# Patient Record
Sex: Male | Born: 1995
Health system: Southern US, Community
[De-identification: ages and names within clinical notes are randomized; demographics above are authoritative.]

## PROBLEM LIST (undated history)

## (undated) DIAGNOSIS — I10 Essential (primary) hypertension: Secondary | ICD-10-CM

## (undated) HISTORY — PX: WISDOM TOOTH EXTRACTION: SHX21

---
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
## 2014-04-13 ENCOUNTER — Emergency Department (HOSPITAL_BASED_OUTPATIENT_CLINIC_OR_DEPARTMENT_OTHER)
Admission: EM | Admit: 2014-04-13 | Discharge: 2014-04-13 | Disposition: A | Discharge status: Home / Self Care | Payer: 59 | Attending: Emergency Medicine | Admitting: Emergency Medicine

## 2014-04-13 ENCOUNTER — Encounter (HOSPITAL_BASED_OUTPATIENT_CLINIC_OR_DEPARTMENT_OTHER): Payer: Self-pay | Admitting: Emergency Medicine

## 2014-04-13 DIAGNOSIS — L03031 Cellulitis of right toe: Secondary | ICD-10-CM | POA: Insufficient documentation

## 2014-04-13 DIAGNOSIS — M79674 Pain in right toe(s): Secondary | ICD-10-CM | POA: Diagnosis present

## 2014-04-13 MED ORDER — LIDOCAINE-EPINEPHRINE 2 %-1:100000 IJ SOLN
20.0000 mL | Freq: Once | INTRAMUSCULAR | Status: AC
  Administered 2014-04-13: 20 mL
  Filled 2014-04-13: qty 1

## 2014-04-13 MED ORDER — SULFAMETHOXAZOLE-TRIMETHOPRIM 800-160 MG PO TABS: 1.0000 | ORAL_TABLET | Freq: Two times a day (BID) | ORAL | Status: AC

## 2014-04-13 NOTE — ED Provider Notes (Signed)
CSN: [id number]     Arrival date & time 04/13/14  2040 History   First MD Initiated Contact with Patient 04/13/14 2111     Chief Complaint  Patient presents with  . Toe Pain     (Consider location/radiation/quality/duration/timing/severity/associated sxs/prior Treatment) HPI Comments: This is a 18 y/o male who presents to the ED complaining of right big toe pain since May. Pt reports he had his toe drained in May for an ingrown toenail, states it was better for about 2 weeks and returned. He reports he ate nor the fact that it was still bothering him, until over the past few days when it started bothering him at night. Denies fevers or drainage. He has never seen a podiatrist. No injury or trauma.  Patient is a 18 y.o. male presenting with toe pain. The history is provided by the patient.  Toe Pain Pertinent negatives include no fever or numbness.    History reviewed. No pertinent past medical history. History reviewed. No pertinent past surgical history. History reviewed. No pertinent family history. History  Substance Use Topics  . Smoking status: Never Smoker   . Smokeless tobacco: Not on file  . Alcohol Use: No    Review of Systems  Constitutional: Negative.  Negative for fever.  HENT: Negative.   Respiratory: Negative.   Cardiovascular: Negative.   Musculoskeletal:       +R big toe pain.  Skin: Positive for color change.  Neurological: Negative for numbness.      Allergies  Review of patient's allergies indicates no known allergies.  Home Medications   Prior to Admission medications   Medication Sig Start Date End Date Taking? Authorizing Provider  sulfamethoxazole-trimethoprim (BACTRIM DS,SEPTRA DS) 800-160 MG per tablet Take 1 tablet by mouth 2 (two) times daily. 04/13/14 04/20/14  Jamelia Varano M Eual Lindstrom, PA-C   BP 157/87 mmHg  Pulse 88  Temp(Src) 99.2 F (37.3 C) (Oral)  Resp 18  Ht 5\' 7"  (1.702 m)  Wt 209 lb (94.802 kg)  BMI 32.73 kg/m2  SpO2 98% Physical  Exam  Constitutional: He is oriented to person, place, and time. He appears well-developed and well-nourished. No distress.  HENT:  Head: Normocephalic and atraumatic.  Eyes: Conjunctivae and EOM are normal.  Neck: Normal range of motion. Neck supple.  Cardiovascular: Normal rate, regular rhythm and normal heart sounds.   Pulmonary/Chest: Effort normal and breath sounds normal.  Musculoskeletal: Normal range of motion.  Paronychia of right great toe. Scabbing, erythema, and tenderness medial to nail bed and below cuticle line. Erythema does not spread throughout toe or cross joint line. FROM. Cap refill < 3 seconds.  Neurological: He is alert and oriented to person, place, and time.  Skin: Skin is warm and dry.  Psychiatric: He has a normal mood and affect. His behavior is normal.  Nursing note and vitals reviewed.   ED Course  NERVE BLOCK Date/Time: 04/13/2014 9:42 PM Performed by: Kathrynn SpeedHESS, Angelmarie Ponzo M Authorized by: Kathrynn SpeedHESS, Emalie Mcwethy M Consent: Verbal consent obtained. Risks and benefits: risks, benefits and alternatives were discussed Consent given by: patient Patient identity confirmed: verbally with patient Body area: lower extremity Nerve: digital Laterality: right Patient sedated: no Patient position: sitting Needle gauge: 25 G Location technique: anatomical landmarks Local anesthetic: lidocaine 2% with epinephrine Anesthetic total: 4 ml Outcome: pain improved  INCISION AND DRAINAGE Performed by: Celene SkeenHess, Tyrome Donatelli Consent: Verbal consent obtained. Risks and benefits: risks, benefits and alternatives were discussed Type: abscess  Body area: right great toe  Anesthesia: digital  block  Local anesthetic: lidocaine 2% with epinephrine  Anesthetic total: 4 ml  Complexity: simple  Drainage: purulent  Drainage amount: medium  Packing material: none  Patient tolerance: Patient tolerated the procedure well with no immediate complications.     (including critical care  time) Labs Review Labs Reviewed - No data to display  Imaging Review No results found.   EKG Interpretation None      MDM   Final diagnoses:  Paronychia of great toe, right   Pt with paronychia. NAD. VSS. This has been ongoing since May, 6 months ago. I advised pt to be evaluated earlier if this were to occur again. Paronychia drained. Small area of surrounding cellulitis. Will start pt on abx. Advised warm soaks. Stable for d/c. Return precautions given. Patient states understanding of treatment care plan and is agreeable.  Kathrynn SpeedRobyn M Linnette Panella, PA-C 04/13/14 [phone]  Toy CookeyMegan Docherty, MD 04/14/14 [phone]

## 2014-04-13 NOTE — Discharge Instructions (Signed)
Take bactrim as directed twice daily. Follow up with the podiatrist. Use warm soaks frequently throughout the day.  Paronychia Paronychia is an inflammatory reaction involving the folds of the skin surrounding the fingernail. This is commonly caused by an infection in the skin around a nail. The most common cause of paronychia is frequent wetting of the hands (as seen with bartenders, food servers, nurses or others who wet their hands). This makes the skin around the fingernail susceptible to infection by bacteria (germs) or fungus. Other predisposing factors are:  Aggressive manicuring.  Nail biting.  Thumb sucking. The most common cause is a staphylococcal (a type of germ) infection, or a fungal (Candida) infection. When caused by a germ, it usually comes on suddenly with redness, swelling, pus and is often painful. It may get under the nail and form an abscess (collection of pus), or form an abscess around the nail. If the nail itself is infected with a fungus, the treatment is usually prolonged and may require oral medicine for up to one year. Your caregiver will determine the length of time treatment is required. The paronychia caused by bacteria (germs) may largely be avoided by not pulling on hangnails or picking at cuticles. When the infection occurs at the tips of the finger it is called felon. When the cause of paronychia is from the herpes simplex virus (HSV) it is called herpetic whitlow. TREATMENT  When an abscess is present treatment is often incision and drainage. This means that the abscess must be cut open so the pus can get out. When this is done, the following home care instructions should be followed. HOME CARE INSTRUCTIONS   It is important to keep the affected fingers very dry. Rubber or plastic gloves over cotton gloves should be used whenever the hand must be placed in water.  Keep wound clean, dry and dressed as suggested by your caregiver between warm soaks or warm  compresses.  Soak in warm water for fifteen to twenty minutes three to four times per day for bacterial infections. Fungal infections are very difficult to treat, so often require treatment for long periods of time.  For bacterial (germ) infections take antibiotics (medicine which kill germs) as directed and finish the prescription, even if the problem appears to be solved before the medicine is gone.  Only take over-the-counter or prescription medicines for pain, discomfort, or fever as directed by your caregiver. SEEK IMMEDIATE MEDICAL CARE IF:  You have redness, swelling, or increasing pain in the wound.  You notice pus coming from the wound.  You have a fever.  You notice a bad smell coming from the wound or dressing. Document Released: 11/05/2000 Document Revised: 08/04/2011 Document Reviewed: 07/07/2008 [facility] Patient Information 2015 [place], [place]. This information is not intended to replace advice given to you by your health care provider. Make sure you discuss any questions you have with your health care provider.

## 2014-04-13 NOTE — ED Notes (Signed)
Rt great toe pain,  Ingrown toe nail

## 2014-08-21 ENCOUNTER — Emergency Department (HOSPITAL_BASED_OUTPATIENT_CLINIC_OR_DEPARTMENT_OTHER)
Admission: EM | Admit: 2014-08-21 | Discharge: 2014-08-21 | Disposition: A | Discharge status: Home / Self Care | Payer: Self-pay | Attending: Emergency Medicine | Admitting: Emergency Medicine

## 2014-08-21 ENCOUNTER — Encounter (HOSPITAL_BASED_OUTPATIENT_CLINIC_OR_DEPARTMENT_OTHER): Payer: Self-pay | Admitting: *Deleted

## 2014-08-21 DIAGNOSIS — L600 Ingrowing nail: Secondary | ICD-10-CM | POA: Insufficient documentation

## 2014-08-21 MED ORDER — TRAMADOL HCL 50 MG PO TABS: 50.0000 mg | ORAL_TABLET | Freq: Four times a day (QID) | ORAL | Status: DC | PRN

## 2014-08-21 MED ORDER — IBUPROFEN 800 MG PO TABS: 800.0000 mg | ORAL_TABLET | Freq: Three times a day (TID) | ORAL | Status: DC | PRN

## 2014-08-21 MED ORDER — LIDOCAINE HCL (PF) 1 % IJ SOLN
5.0000 mL | Freq: Once | INTRAMUSCULAR | Status: AC
  Administered 2014-08-21: 5 mL
  Filled 2014-08-21: qty 5

## 2014-08-21 NOTE — ED Notes (Signed)
In grown toenail to his right great toe.

## 2014-08-21 NOTE — ED Notes (Signed)
EDP at bedside for procedure to right great toe

## 2014-08-21 NOTE — ED Provider Notes (Signed)
TIME SEEN: 9:10 PM  CHIEF COMPLAINT: Right great toe pain  HPI: Pt is a 19 y.o. male with history of recurrent ingrown 2 mils to the right great toe who is here in November with a paronychia he states he has had pain and swelling in this toenail since that time but has not followed up with his primary care physician. Denies any fever. No drainage. No injury. No numbness, tingling or focal weakness.  ROS: See HPI Constitutional: no fever  Eyes: no drainage  ENT: no runny nose   Cardiovascular:  no chest pain  Resp: no SOB  GI: no vomiting GU: no dysuria Integumentary: no rash  Allergy: no hives  Musculoskeletal: no leg swelling  Neurological: no slurred speech ROS otherwise negative  PAST MEDICAL HISTORY/PAST SURGICAL HISTORY:  History reviewed. No pertinent past medical history.  MEDICATIONS:  Prior to Admission medications   Not on File    ALLERGIES:  No Known Allergies  SOCIAL HISTORY:  History  Substance Use Topics  . Smoking status: Never Smoker   . Smokeless tobacco: Not on file  . Alcohol Use: No    FAMILY HISTORY: No family history on file.  EXAM: BP 143/70 mmHg  Pulse 81  Temp(Src) 98.7 F (37.1 C) (Oral)  Resp 20  Ht 5\' 7"  (1.702 m)  Wt 200 lb (90.719 kg)  BMI 31.32 kg/m2  SpO2 100% CONSTITUTIONAL: Alert and oriented and responds appropriately to questions. Well-appearing; well-nourished HEAD: Normocephalic EYES: Conjunctivae clear, PERRL ENT: normal nose; no rhinorrhea; moist mucous membranes; pharynx without lesions noted NECK: Supple, no meningismus, no LAD  CARD: RRR; S1 and S2 appreciated; no murmurs, no clicks, no rubs, no gallops RESP: Normal chest excursion without splinting or tachypnea; breath sounds clear and equal bilaterally; no wheezes, no rhonchi, no rales,  ABD/GI: Normal bowel sounds; non-distended; soft, non-tender, no rebound, no guarding BACK:  The back appears normal and is non-tender to palpation, there is no CVA  tenderness EXT: Ingrown toenail to the right great toe to the medial aspect with thickening of the skin around the medial aspect of the toenail but no obvious paronychia, no drainage, no erythema or warmth, no joint effusions, no bony tenderness, no sign of gout or cellulitis, Normal ROM in all joints; 2+ DP pulses bilaterally, sensation to light touch intact diffusely, otherwise extremities are non-tender to palpation; no edema; normal capillary refill; no cyanosis    SKIN: Normal color for age and race; warm NEURO: Moves all extremities equally PSYCH: The patient's mood and manner are appropriate. Grooming and personal hygiene are appropriate.  MEDICAL DECISION MAKING: Patient here with ingrown toenail without obvious sign of infection. He is requesting that I tried to remove the toenail today. Have discussed with patient and family that the toenail will continue to grow back and he needs to follow-up with her primary care physician or podiatrist. He is neurovascularly intact distally with no history of injury.  ED PROGRESS: Partial nail removal of the nail of the right great toe that was embedded under the skin on the medial aspect without any obvious sign of paronychia and no purulent drainage. Patient has pain relief after digital block. We'll discharge with prescriptions for ibuprofen, tramadol. We'll provide work note. Will also give information for podiatry follow-up. I do not feel he needs to be on antibiotics at this time. Have instructed him to soak his foot several times a day and to follow-up as an outpatient otherwise his nail will grow back and he  will have another ingrown 10 ingrown toenail that is at risk for infection. Discussed return precautions. He verbalizes understanding and is comfortable with plan.   NAIL REMOVAL Date/Time: 08/21/2014 9:59 PM Performed by: Raelyn Number Authorized by: Raelyn Number Consent: Verbal consent obtained. Risks and benefits: risks, benefits and  alternatives were discussed Consent given by: patient Patient identity confirmed: verbally with patient Location: right foot Location details: right big toe Anesthesia: local infiltration Local anesthetic: lidocaine 1% without epinephrine Anesthetic total: 5 ml Patient sedated: no Preparation: skin prepped with Betadine and sterile field established Amount removed: partial Side: Medial  Nail bed sutured: no Dressing: antibiotic ointment and 4x4 Patient tolerance: Patient tolerated the procedure well with no immediate complications  NERVE BLOCK Date/Time: 08/21/2014 10:02 PM Performed by: Raelyn Number Authorized by: Raelyn Number Consent: Verbal consent obtained. Risks and benefits: risks, benefits and alternatives were discussed Consent given by: patient Patient identity confirmed: verbally with patient Indications: pain relief Body area: lower extremity Nerve: digital Laterality: right Patient sedated: no Preparation: Patient was prepped and draped in the usual sterile fashion. Patient position: sitting Needle gauge: 25 G Location technique: anatomical landmarks Local anesthetic: lidocaine 1% without epinephrine Anesthetic total: 5 ml Outcome: pain improved Patient tolerance: Patient tolerated the procedure well with no immediate complications    Layla Maw Tanyiah Laurich, DO 08/21/14 2203

## 2014-08-21 NOTE — Discharge Instructions (Signed)
Ingrown Toenail An ingrown toenail occurs when the sharp edge of your toenail grows into the skin. Causes of ingrown toenails include toenails clipped too far back or poorly fitting shoes. Activities involving sudden stops (basketball, tennis) causing "toe jamming" may lead to an ingrown nail. HOME CARE INSTRUCTIONS   Soak the whole foot in warm soapy water for 20 minutes, 3 times per day.  You may lift the edge of the nail away from the sore skin by wedging a small piece of cotton under the corner of the nail. Be careful not to dig (traumatize) and cause more injury to the area.  Wear shoes that fit well. While the ingrown nail is causing problems, sandals may be beneficial.  Trim your toenails regularly and carefully. Cut your toenails straight across, not in a curve. This will prevent injury to the skin at the corners of the toenail.  Keep your feet clean and dry.  Crutches may be helpful early in treatment if walking is painful.  Antibiotics, if prescribed, should be taken as directed.  Return for a wound check in 2 days or as directed.  Only take over-the-counter or prescription medicines for pain, discomfort, or fever as directed by your caregiver. SEEK IMMEDIATE MEDICAL CARE IF:   You have a fever.  You have increasing pain, redness, swelling, or heat at the wound site.  Your toe is not better in 7 days. If conservative treatment is not successful, surgical removal of a portion or all of the nail may be necessary. MAKE SURE YOU:   Understand these instructions.  Will watch your condition.  Will get help right away if you are not doing well or get worse. Document Released: 05/09/2000 Document Revised: 08/04/2011 Document Reviewed: 05/03/2008 ExitCare Patient Information 2015 [employer]. This information is not intended to replace advice given to you by your health care provider. Make sure you discuss any questions you have with your health care provider.  

## 2014-09-06 ENCOUNTER — Emergency Department (HOSPITAL_BASED_OUTPATIENT_CLINIC_OR_DEPARTMENT_OTHER)
Admission: EM | Admit: 2014-09-06 | Discharge: 2014-09-06 | Disposition: A | Discharge status: Home / Self Care | Payer: Medicaid Other | Attending: Emergency Medicine | Admitting: Emergency Medicine

## 2014-09-06 ENCOUNTER — Encounter (HOSPITAL_BASED_OUTPATIENT_CLINIC_OR_DEPARTMENT_OTHER): Payer: Self-pay | Admitting: *Deleted

## 2014-09-06 DIAGNOSIS — H6122 Impacted cerumen, left ear: Secondary | ICD-10-CM | POA: Insufficient documentation

## 2014-09-06 NOTE — ED Notes (Signed)
Left ear pain starting today 

## 2014-09-06 NOTE — ED Notes (Signed)
MD at bedside. 

## 2014-09-06 NOTE — ED Provider Notes (Signed)
CSN: [id number]     Arrival date & time 09/06/14  1035 History  This chart was scribed for Glynn Octave, MD by Leone Payor, ED Scribe. This patient was seen in room MH03/MH03 and the patient's care was started 11:46 AM.    Chief Complaint  Patient presents with  . Otalgia    The history is provided by the patient. No language interpreter was used.     HPI Comments: Kevin Sosa is a 19 y.o. male who presents to the Emergency Department complaining of constant left sided otalgia that began this morning. He states the pain is aggravated by noise but denies alleviating factors. He denies similar symptoms in the past. He denies fever, rhinorrhea, sore throat, HA.   History reviewed. No pertinent past medical history. History reviewed. No pertinent past surgical history. No family history on file. History  Substance Use Topics  . Smoking status: Never Smoker   . Smokeless tobacco: Never Used  . Alcohol Use: No    Review of Systems  A complete 10 system review of systems was obtained and all systems are negative except as noted in the HPI and PMH.    Allergies  Review of patient's allergies indicates no known allergies.  Home Medications   Prior to Admission medications   Medication Sig Start Date End Date Taking? Authorizing Provider  ibuprofen (ADVIL,MOTRIN) 800 MG tablet Take 1 tablet (800 mg total) by mouth every 8 (eight) hours as needed for mild pain. 08/21/14   Kristen N Ward, DO  traMADol (ULTRAM) 50 MG tablet Take 1 tablet (50 mg total) by mouth every 6 (six) hours as needed. 08/21/14   Kristen N Ward, DO   BP 144/66 mmHg  Pulse 78  Temp(Src) 98.3 F (36.8 C) (Oral)  Resp 16  Ht  (1.702 m)  Wt 200 lb (90.719 kg)  BMI 31.32 kg/m2  SpO2 100% Physical Exam  Constitutional: He is oriented to person, place, and time. He appears well-developed and well-nourished. No distress.  HENT:  Head: Normocephalic and atraumatic.  Right Ear: Tympanic membrane, external  ear and ear canal normal.  Left Ear: Tympanic membrane and external ear normal.  Mouth/Throat: Oropharynx is clear and moist. No oropharyngeal exudate.  Left ear: normal external appearance, no tragus pain, no mastoid pain. Bilateral TM's appears normal, few dried pieces of wax in ear canal   Eyes: Conjunctivae and EOM are normal. Pupils are equal, round, and reactive to light.  Neck: Normal range of motion. Neck supple.  No meningismus.  Cardiovascular: Normal rate, regular rhythm, normal heart sounds and intact distal pulses.   No murmur heard. Pulmonary/Chest: Effort normal and breath sounds normal. No respiratory distress.  Abdominal: Soft. There is no tenderness. There is no rebound and no guarding.  Musculoskeletal: Normal range of motion. He exhibits no edema or tenderness.  Neurological: He is alert and oriented to person, place, and time. No cranial nerve deficit. He exhibits normal muscle tone. Coordination normal.  No ataxia on finger to nose bilaterally. No pronator drift. 5/5 strength throughout. CN 2-12 intact. Negative Romberg. Equal grip strength. Sensation intact. Gait is normal.   Skin: Skin is warm.  Psychiatric: He has a normal mood and affect. His behavior is normal.  Nursing note and vitals reviewed.   ED Course  Procedures (including critical care time)  DIAGNOSTIC STUDIES: Oxygen Saturation is 100% on RA, normal by my interpretation.    COORDINATION OF CARE: 11:48 AM Discussed treatment plan with pt at bedside  and pt agreed to plan.   Labs Review Labs Reviewed - No data to display  Imaging Review No results found.   EKG Interpretation None      MDM   Final diagnoses:  Excessive cerumen in ear canal, left   atraumatic left ear pain today. No difficulty breathing or swallowing. No bleeding from the ear.  Exam shows no evidence of otitis externa or media. There is a small amount of dry cerumen in the ear canal.  Symptoms improved after  irrigation. Tympanic membrane is intact.  Discussed earwax removal precautions. Follow-up with PCP.   Glynn OctaveStephen Janes Colegrove, MD 09/06/14 1311

## 2014-09-06 NOTE — Discharge Instructions (Signed)
Cerumen Impaction °A cerumen impaction is when the wax in your ear forms a plug. This plug usually causes reduced hearing. Sometimes it also causes an earache or dizziness. Removing a cerumen impaction can be difficult and painful. The wax sticks to the ear canal. The canal is sensitive and bleeds easily. If you try to remove a heavy wax buildup with a cotton tipped swab, you may push it in further. °Irrigation with water, suction, and small ear curettes may be used to clear out the wax. If the impaction is fixed to the skin in the ear canal, ear drops may be needed for a few days to loosen the wax. People who build up a lot of wax frequently can use ear wax removal products available in your local drugstore. °SEEK MEDICAL CARE IF:  °You develop an earache, increased hearing loss, or marked dizziness. °Document Released: 06/19/2004 Document Revised: 08/04/2011 Document Reviewed: 08/09/2009 °ExitCare® Patient Information ©2015 [employer]. This information is not intended to replace advice given to you by your health care provider. Make sure you discuss any questions you have with your health care provider. ° °

## 2014-09-06 NOTE — ED Notes (Signed)
Pt given note for school

## 2014-10-11 ENCOUNTER — Emergency Department (HOSPITAL_BASED_OUTPATIENT_CLINIC_OR_DEPARTMENT_OTHER): Payer: Medicaid Other

## 2014-10-11 ENCOUNTER — Encounter (HOSPITAL_BASED_OUTPATIENT_CLINIC_OR_DEPARTMENT_OTHER): Payer: Self-pay | Admitting: *Deleted

## 2014-10-11 ENCOUNTER — Emergency Department (HOSPITAL_BASED_OUTPATIENT_CLINIC_OR_DEPARTMENT_OTHER)
Admission: EM | Admit: 2014-10-11 | Discharge: 2014-10-11 | Disposition: A | Discharge status: Home / Self Care | Payer: Medicaid Other | Attending: Emergency Medicine | Admitting: Emergency Medicine

## 2014-10-11 DIAGNOSIS — Y92310 Basketball court as the place of occurrence of the external cause: Secondary | ICD-10-CM | POA: Insufficient documentation

## 2014-10-11 DIAGNOSIS — S62619A Displaced fracture of proximal phalanx of unspecified finger, initial encounter for closed fracture: Secondary | ICD-10-CM

## 2014-10-11 DIAGNOSIS — Y9367 Activity, basketball: Secondary | ICD-10-CM | POA: Insufficient documentation

## 2014-10-11 DIAGNOSIS — X58XXXA Exposure to other specified factors, initial encounter: Secondary | ICD-10-CM | POA: Insufficient documentation

## 2014-10-11 DIAGNOSIS — S6991XA Unspecified injury of right wrist, hand and finger(s), initial encounter: Secondary | ICD-10-CM

## 2014-10-11 DIAGNOSIS — S62612A Displaced fracture of proximal phalanx of right middle finger, initial encounter for closed fracture: Secondary | ICD-10-CM | POA: Insufficient documentation

## 2014-10-11 DIAGNOSIS — Y998 Other external cause status: Secondary | ICD-10-CM | POA: Insufficient documentation

## 2014-10-11 MED ORDER — TRAMADOL HCL 50 MG PO TABS: 50.0000 mg | ORAL_TABLET | Freq: Four times a day (QID) | ORAL | Status: DC | PRN

## 2014-10-11 NOTE — Discharge Instructions (Signed)

## 2014-10-11 NOTE — ED Notes (Signed)
Pt c/o right middle finger injury x 3 days ago

## 2014-10-11 NOTE — ED Notes (Signed)
Pt unable to sign pharm in chart

## 2014-10-11 NOTE — ED Provider Notes (Signed)
CSN: [id number]     Arrival date & time 10/11/14  1446 History   First MD Initiated Contact with Patient 10/11/14 1453     Chief Complaint  Patient presents with  . Finger Injury     (Consider location/radiation/quality/duration/timing/severity/associated sxs/prior Treatment) HPI Comments: Pt comes in with c/o right middle finger injury. Pt states that he was playing basketball and the finger turned sideways. He put the finger back in place but now he is having pain over the mip joint. Denies numbness.  No language interpreter was used.    History reviewed. No pertinent past medical history. History reviewed. No pertinent past surgical history. History reviewed. No pertinent family history. History  Substance Use Topics  . Smoking status: Never Smoker   . Smokeless tobacco: Never Used  . Alcohol Use: No    Review of Systems  All other systems reviewed and are negative.     Allergies  Review of patient's allergies indicates no known allergies.  Home Medications   Prior to Admission medications   Medication Sig Start Date End Date Taking? Authorizing Provider  ibuprofen (ADVIL,MOTRIN) 800 MG tablet Take 1 tablet (800 mg total) by mouth every 8 (eight) hours as needed for mild pain. 08/21/14   Kristen N Ward, DO  traMADol (ULTRAM) 50 MG tablet Take 1 tablet (50 mg total) by mouth every 6 (six) hours as needed. 08/21/14   Kristen N Ward, DO   BP 131/63 mmHg  Pulse 93  Temp(Src) 98.9 F (37.2 C) (Oral)  Resp 16  Ht 5\' 7"  (1.702 m)  Wt 200 lb (90.719 kg)  BMI 31.32 kg/m2  SpO2 100% Physical Exam  Constitutional: He is oriented to person, place, and time. He appears well-developed and well-nourished.  Cardiovascular: Normal rate and regular rhythm.   Pulmonary/Chest: Effort normal and breath sounds normal.  Musculoskeletal:  Tenderness with mild swelling to the pip and mip of the right third finger  Neurological: He is alert and oriented to person, place, and time.   Skin: Skin is warm and dry.  Psychiatric: He has a normal mood and affect.  Nursing note and vitals reviewed.   ED Course  Procedures (including critical care time) Labs Review Labs Reviewed - No data to display  Imaging Review Dg Hand Complete Right  10/11/2014   CLINICAL DATA:  injured his right middle finger playing basketball 3 days ago, has pains and swelling to right middle finger proximal IP joint and down into the metacarpals, no other complaints  EXAM: RIGHT HAND - COMPLETE 3+ VIEW  COMPARISON:  None.  FINDINGS: Oblique minimally comminuted fracture of the distal shaft proximal phalanx right long finger, distracted less than 2 mm. There is extension to the distal subchondral cortex suggested on the oblique and lateral radiograph, but no step-off deformity. Normal alignment and mineralization. No other acute bone abnormalities. No significant osseous degenerative change.  IMPRESSION: 1. Oblique minimally comminuted fracture, proximal phalanx right long finger.   Electronically Signed   By: Corlis Leak  Hassell M.D.   On: 10/11/2014 15:56     EKG Interpretation None      MDM   Final diagnoses:  Proximal phalanx fracture of finger, closed, initial encounter    Pt splinted and given hand follow up. Given ultram for pain. Neurovascularly intact    Kevin LowerVrinda [name], NP 10/11/14 1604  Geoffery Lyonsouglas Delo, MD 10/12/14 1308

## 2014-12-14 ENCOUNTER — Encounter (HOSPITAL_BASED_OUTPATIENT_CLINIC_OR_DEPARTMENT_OTHER): Payer: Self-pay

## 2014-12-14 ENCOUNTER — Emergency Department (HOSPITAL_BASED_OUTPATIENT_CLINIC_OR_DEPARTMENT_OTHER)
Admission: EM | Admit: 2014-12-14 | Discharge: 2014-12-14 | Disposition: A | Discharge status: Home / Self Care | Payer: Medicaid Other | Attending: Emergency Medicine | Admitting: Emergency Medicine

## 2014-12-14 ENCOUNTER — Emergency Department (HOSPITAL_BASED_OUTPATIENT_CLINIC_OR_DEPARTMENT_OTHER): Payer: Medicaid Other

## 2014-12-14 DIAGNOSIS — L03031 Cellulitis of right toe: Secondary | ICD-10-CM | POA: Insufficient documentation

## 2014-12-14 DIAGNOSIS — L03011 Cellulitis of right finger: Secondary | ICD-10-CM

## 2014-12-14 DIAGNOSIS — L600 Ingrowing nail: Secondary | ICD-10-CM

## 2014-12-14 MED ORDER — LIDOCAINE HCL (PF) 1 % IJ SOLN
5.0000 mL | Freq: Once | INTRAMUSCULAR | Status: DC
  Filled 2014-12-14: qty 5

## 2014-12-14 MED ORDER — LIDOCAINE HCL (PF) 1 % IJ SOLN
30.0000 mL | Freq: Once | INTRAMUSCULAR | Status: DC
  Filled 2014-12-14: qty 30

## 2014-12-14 MED ORDER — SULFAMETHOXAZOLE-TRIMETHOPRIM 800-160 MG PO TABS: 1.0000 | ORAL_TABLET | Freq: Two times a day (BID) | ORAL | Status: AC

## 2014-12-14 MED ORDER — IBUPROFEN 800 MG PO TABS: 800.0000 mg | ORAL_TABLET | Freq: Three times a day (TID) | ORAL | Status: DC

## 2014-12-14 MED ORDER — CEPHALEXIN 500 MG PO CAPS: 500.0000 mg | ORAL_CAPSULE | Freq: Four times a day (QID) | ORAL | Status: DC

## 2014-12-14 NOTE — ED Notes (Signed)
C/o right foot pain/swelling x 2.5 months-injury 1-1.5 yrs ago-states he has been seen x 3 for same c/o

## 2014-12-14 NOTE — Discharge Instructions (Signed)

## 2014-12-14 NOTE — ED Notes (Signed)
MD at bedside. 

## 2014-12-14 NOTE — ED Provider Notes (Signed)
CSN: [id number]     Arrival date & time 12/14/14  1907 History  This chart was scribed for Kevin Octave, MD by Budd Palmer, ED Scribe. This patient was seen in room MHFT1/MHFT1 and the patient's care was started at 8:39 PM.    Chief Complaint  Patient presents with  . Foot Pain   The history is provided by the patient. No language interpreter was used.   HPI Comments: Kevin Sosa is a 19 y.o. male who presents to the Emergency Department complaining of constant right great toe pain and swelling onset 4 months ago. He states the swelling has now started to go around from the left to the right side of the toenail. He was at the ED 3 months ago, where he was told he had an ingrown nail. He was given antibiotics which effected great improvement. He re-injured the toe a few months ago, and states it has been flaring up ever since. Pt denies fever, vomiting, CP, SOB, and abdominal pain.  History reviewed. No pertinent past medical history. History reviewed. No pertinent past surgical history. No family history on file. History  Substance Use Topics  . Smoking status: Never Smoker   . Smokeless tobacco: Never Used  . Alcohol Use: No    Review of Systems A complete 10 system review of systems was obtained and all systems are negative except as noted in the HPI and PMH.   Allergies  Review of patient's allergies indicates no known allergies.  Home Medications   Prior to Admission medications   Medication Sig Start Date End Date Taking? Authorizing Provider  cephALEXin (KEFLEX) 500 MG capsule Take 1 capsule (500 mg total) by mouth 4 (four) times daily. 12/14/14   Kevin Octave, MD  ibuprofen (ADVIL,MOTRIN) 800 MG tablet Take 1 tablet (800 mg total) by mouth 3 (three) times daily. 12/14/14   Kevin Octave, MD  sulfamethoxazole-trimethoprim (BACTRIM DS,SEPTRA DS) 800-160 MG per tablet Take 1 tablet by mouth 2 (two) times daily. 12/14/14 12/21/14  Kevin Octave, MD   BP 165/87 mmHg   Pulse 86  Temp(Src) 98.9 F (37.2 C) (Oral)  Resp 18  Ht 5\' 7"  (1.702 m)  Wt 215 lb (97.523 kg)  BMI 33.67 kg/m2  SpO2 100% Physical Exam  Constitutional: He is oriented to person, place, and time. He appears well-developed and well-nourished. No distress.  HENT:  Head: Normocephalic and atraumatic.  Mouth/Throat: Oropharynx is clear and moist. No oropharyngeal exudate.  Eyes: Conjunctivae and EOM are normal. Pupils are equal, round, and reactive to light.  Neck: Normal range of motion. Neck supple.  No meningismus.  Cardiovascular: Normal rate, regular rhythm, normal heart sounds and intact distal pulses.   No murmur heard. Pulmonary/Chest: Effort normal and breath sounds normal. No respiratory distress.  Abdominal: Soft. There is no tenderness. There is no rebound and no guarding.  Musculoskeletal: Normal range of motion. He exhibits no edema or tenderness.  Right great toe has thickening of nail folds with medial worse than lateral.Diffuse tenderness with mild erythema. Ingrown toenail. Some purulent and bloody drainage.  Neurological: He is alert and oriented to person, place, and time. No cranial nerve deficit. He exhibits normal muscle tone. Coordination normal.  No ataxia on finger to nose bilaterally. No pronator drift. 5/5 strength throughout. CN 2-12 intact. Negative Romberg. Equal grip strength. Sensation intact. Gait is normal.   Skin: Skin is warm. There is erythema.  Psychiatric: He has a normal mood and affect. His behavior is normal.  Nursing  note and vitals reviewed.   ED Course  NERVE BLOCK Date/Time: 12/14/2014 11:30 PM Performed by: Kevin Sosa Authorized by: Kevin Sosa Consent: Verbal consent obtained. Risks and benefits: risks, benefits and alternatives were discussed Consent given by: patient Patient understanding: patient states understanding of the procedure being performed Patient consent: the patient's understanding of the procedure matches  consent given Procedure consent: procedure consent matches procedure scheduled Patient identity confirmed: provided demographic data Indications: pain relief Body area: lower extremity Nerve: digital Laterality: right Patient sedated: no Preparation: Patient was prepped and draped in the usual sterile fashion. Patient position: sitting Needle gauge: 25 G Location technique: anatomical landmarks Local anesthetic: lidocaine 1% without epinephrine Anesthetic total: 10 ml Outcome: pain improved Patient tolerance: Patient tolerated the procedure well with no immediate complications  NAIL REMOVAL Date/Time: 12/14/2014 11:30 PM Performed by: Kevin Sosa Authorized by: Kevin Sosa Consent: Verbal consent obtained. Risks and benefits: risks, benefits and alternatives were discussed Consent given by: patient Patient understanding: patient states understanding of the procedure being performed Patient consent: the patient's understanding of the procedure matches consent given Procedure consent: procedure consent matches procedure scheduled Relevant documents: relevant documents present and verified Patient identity confirmed: provided demographic data and verbally with patient Time out: Immediately prior to procedure a "time out" was called to verify the correct patient, procedure, equipment, support staff and site/side marked as required. Location: right foot Location details: right big toe Anesthesia: local infiltration Local anesthetic: lidocaine 1% without epinephrine Anesthetic total: 10 ml Patient sedated: no Preparation: skin prepped with Betadine Amount removed: partial Wedge excision of skin of nail fold: no Nail bed sutured: no Nail matrix removed: none Removed nail replaced and anchored: no Dressing: 4x4 and antibiotic ointment Patient tolerance: Patient tolerated the procedure well with no immediate complications   \  INCISION AND DRAINAGE Performed by:  Kevin Sosa Consent: Verbal consent obtained. Risks and benefits: risks, benefits and alternatives were discussed Type: abscess  Body area: R great toe  Anesthesia: local infiltration  Incision was made with a scalpel.  Local anesthetic: lidocaine 1% without epinephrine  Anesthetic total: 10 ml  Complexity: complex Blunt dissection to break up loculations  Drainage: purulent  Drainage amount: small  Packing material: none   Patient tolerance: Patient tolerated the procedure well with no immediate complications.    DIAGNOSTIC STUDIES: Oxygen Saturation is 100% on RA, normal by my interpretation.    COORDINATION OF CARE: 8:44 PM - Discussed plans to perform I&D and order diagnostic imaging. Will refer to a podiatrist. Pt advised of plan for treatment and pt agrees.  Labs Review Labs Reviewed - No data to display  Imaging Review Dg Toe Great Right  12/14/2014   CLINICAL DATA:  Great toe pain and swelling  EXAM: RIGHT GREAT TOE  COMPARISON:  None.  FINDINGS: There is no evidence of fracture or dislocation. There is no evidence of arthropathy or other focal bone abnormality. Soft tissues are unremarkable.  IMPRESSION: Negative.   Electronically Signed   By: Natasha Mead M.D.   On: 12/14/2014 21:10     EKG Interpretation None      MDM   Final diagnoses:  Ingrown right big toenail  Paronychia, right  acute on chronic right great toe swelling history of ingrown toenail and paronychia in the past. States worsened after trauma last month. No fever or vomiting.  Patient with swelling of the lateral nail folds, ingrown toenail and paronychia.  Drainage of paronychia as above. Partial removal of right  ingrown toenail. Discussed with patient that he needs podiatry follow-up. Instructed on warm soaks. Instructed on proper care of the area to ensure his nail grows back appropriately. We'll place an antibiotics for paronychia.  Return precautions discussed.    I  personally performed the services described in this documentation, which was scribed in my presence. The recorded information has been reviewed and is accurate.   Kevin Octave, MD 12/14/14 [phone]

## 2014-12-28 ENCOUNTER — Encounter (HOSPITAL_COMMUNITY): Payer: Self-pay | Admitting: Emergency Medicine

## 2014-12-28 ENCOUNTER — Emergency Department (HOSPITAL_COMMUNITY)
Admission: EM | Admit: 2014-12-28 | Discharge: 2014-12-28 | Disposition: A | Discharge status: Home / Self Care | Payer: Medicaid Other | Attending: Emergency Medicine | Admitting: Emergency Medicine

## 2014-12-28 DIAGNOSIS — Z792 Long term (current) use of antibiotics: Secondary | ICD-10-CM | POA: Insufficient documentation

## 2014-12-28 DIAGNOSIS — L600 Ingrowing nail: Secondary | ICD-10-CM | POA: Insufficient documentation

## 2014-12-28 DIAGNOSIS — Z791 Long term (current) use of non-steroidal anti-inflammatories (NSAID): Secondary | ICD-10-CM | POA: Insufficient documentation

## 2014-12-28 MED ORDER — CEPHALEXIN 500 MG PO CAPS: 500.0000 mg | ORAL_CAPSULE | Freq: Three times a day (TID) | ORAL | Status: DC

## 2014-12-28 MED ORDER — BUPIVACAINE HCL (PF) 0.5 % IJ SOLN
20.0000 mL | Freq: Once | INTRAMUSCULAR | Status: AC
  Administered 2014-12-28: 20 mL
  Filled 2014-12-28: qty 30

## 2014-12-28 NOTE — ED Provider Notes (Signed)
CSN: [id number]     Arrival date & time 12/28/14  1317 History  This chart was scribed for non-physician practitioner, Jaynie Crumble, PA-C, working with Mirian Mo, MD, by Budd Palmer ED Scribe. This patient was seen in room WTR6/WTR6 and the patient's care was started at 1:42 PM    Chief Complaint  Patient presents with  . Toe Pain   The history is provided by the patient. No language interpreter was used.   HPI Comments: Kevin Sosa is a 19 y.o. male who presents to the Emergency Department complaining of right great toe pain onset after a dog ran over his foot 1 day ago. He has had an ingrown nail removed at the ED on 7/21 and the dog's claws re-opened the incision site, causing it to bleed. He was prescribed antibiotics but has not taken any. He has been soaking the foot with some relief.    History reviewed. No pertinent past medical history. History reviewed. No pertinent past surgical history. No family history on file. History  Substance Use Topics  . Smoking status: Never Smoker   . Smokeless tobacco: Never Used  . Alcohol Use: No    Review of Systems  Constitutional: Negative for fever.  Skin: Positive for wound.    Allergies  Review of patient's allergies indicates no known allergies.  Home Medications   Prior to Admission medications   Medication Sig Start Date End Date Taking? Authorizing Provider  cephALEXin (KEFLEX) 500 MG capsule Take 1 capsule (500 mg total) by mouth 4 (four) times daily. 12/14/14   Glynn Octave, MD  ibuprofen (ADVIL,MOTRIN) 800 MG tablet Take 1 tablet (800 mg total) by mouth 3 (three) times daily. 12/14/14   Glynn Octave, MD   BP 128/68 mmHg  Pulse 89  Temp(Src) 98.6 F (37 C) (Oral)  Resp 16  SpO2 100% Physical Exam  Constitutional: He is oriented to person, place, and time. He appears well-developed and well-nourished. No distress.  HENT:  Head: Normocephalic and atraumatic.  Mouth/Throat: Oropharynx is clear and  moist.  Eyes: Conjunctivae and EOM are normal. Pupils are equal, round, and reactive to light.  Neck: Normal range of motion. Neck supple. No tracheal deviation present.  Cardiovascular: Normal rate.   Pulmonary/Chest: Breath sounds normal. No respiratory distress.  Abdominal: Soft.  Musculoskeletal: Normal range of motion.  Swelling noted to the distal right toe, ingrown nail on the medial aspect. Tender to palpation. No drainage.  Neurological: He is alert and oriented to person, place, and time.  Skin: Skin is warm and dry.  Psychiatric: He has a normal mood and affect. His behavior is normal.  Nursing note and vitals reviewed.   ED Course  NAIL REMOVAL Date/Time: 12/29/2014 11:05 AM Performed by: Jaynie Crumble Authorized by: Jaynie Crumble Consent: Verbal consent obtained. Consent given by: patient Patient identity confirmed: verbally with patient Location: right foot Anesthesia: digital block Local anesthetic: bupivacaine 0.5% without epinephrine Anesthetic total: 6 ml Patient sedated: no Preparation: skin prepped with Betadine Amount removed: partial Side: radial Wedge excision of skin of nail fold: yes Nail bed sutured: no Removed nail replaced and anchored: no Dressing: antibiotic ointment and gauze roll    DIAGNOSTIC STUDIES: Oxygen Saturation is 100% on RA, normal by my interpretation.    COORDINATION OF CARE: 1:46 PM - Discussed plans to remove more of the nail. Pt advised of plan for treatment and pt agrees.  2:14 PM - Numbed the toe.  2:22 PM - Removed part of the nail  and cleaned the wound area.   Labs Review Labs Reviewed - No data to display  Imaging Review No results found.   EKG Interpretation None      MDM   Final diagnoses:  Ingrown right big toenail    patient with an ingrown toenail. Wide of the nail removed. Dressing applied. Will treat with antibiotics. Follow-up with podiatry. Patient is afebrile, otherwise nontoxic  appearing. Full range of motion of the toe, no tenosynovitis.  Filed Vitals:   12/28/14 1323 12/28/14 1437  BP: 128/68   Pulse: 89 86  Temp: 98.6 F (37 C)   TempSrc: Oral   Resp: 16   SpO2: 100% 98%     I personally performed the services described in this documentation, which was scribed in my presence. The recorded information has been reviewed and is accurate.   Jaynie Crumble, PA-C 12/29/14 1109  Mancel Bale, MD 12/29/14 [phone]

## 2014-12-28 NOTE — Discharge Instructions (Signed)
Take antibiotics as prescribed until all gone. Continue soaks. Ibuprofen for pain. Keep elevated. Follow up with podiatrist.     Infected Ingrown Toenail An infected ingrown toenail occurs when the nail edge grows into the skin and bacteria invade the area. Symptoms include pain, tenderness, swelling, and pus drainage from the edge of the nail. Poorly fitting shoes, minor injuries, and improper cutting of the toenail may also contribute to the problem. You should cut your toenails squarely instead of rounding the edges. Do not cut them too short. Avoid tight or pointed toe shoes. Sometimes the ingrown portion of the nail must be removed. If your toenail is removed, it can take 3-4 months for it to re-grow. HOME CARE INSTRUCTIONS   Soak your infected toe in warm water for 20-30 minutes, 2 to 3 times a day.  Packing or dressings applied to the area should be changed daily.  Take medicine as directed and finish them.  Reduce activities and keep your foot elevated when able to reduce swelling and discomfort. Do this until the infection gets better.  Wear sandals or go barefoot as much as possible while the infected area is sensitive.  See your caregiver for follow-up care in 2-3 days if the infection is not better. SEEK MEDICAL CARE IF:  Your toe is becoming more red, swollen or painful. MAKE SURE YOU:   Understand these instructions.  Will watch your condition.  Will get help right away if you are not doing well or get worse. Document Released: 06/19/2004 Document Revised: 08/04/2011 Document Reviewed: 05/08/2008 [facility] Patient Information 2015 [place], [place]. This information is not intended to replace advice given to you by your health care provider. Make sure you discuss any questions you have with your health care provider.

## 2014-12-28 NOTE — ED Notes (Signed)
Per pt, states right toe pain-had ingrown toe nail removed and yesterday dog ran over foot

## 2015-02-13 ENCOUNTER — Encounter (HOSPITAL_COMMUNITY): Payer: Self-pay | Admitting: Emergency Medicine

## 2015-02-13 ENCOUNTER — Emergency Department (HOSPITAL_COMMUNITY)
Admission: EM | Admit: 2015-02-13 | Discharge: 2015-02-13 | Disposition: A | Discharge status: Home / Self Care | Payer: Medicaid Other | Attending: Emergency Medicine | Admitting: Emergency Medicine

## 2015-02-13 ENCOUNTER — Emergency Department (HOSPITAL_COMMUNITY): Payer: Medicaid Other

## 2015-02-13 DIAGNOSIS — R197 Diarrhea, unspecified: Secondary | ICD-10-CM | POA: Insufficient documentation

## 2015-02-13 DIAGNOSIS — R059 Cough, unspecified: Secondary | ICD-10-CM

## 2015-02-13 DIAGNOSIS — J069 Acute upper respiratory infection, unspecified: Secondary | ICD-10-CM | POA: Insufficient documentation

## 2015-02-13 DIAGNOSIS — R51 Headache: Secondary | ICD-10-CM | POA: Insufficient documentation

## 2015-02-13 DIAGNOSIS — R05 Cough: Secondary | ICD-10-CM

## 2015-02-13 DIAGNOSIS — R112 Nausea with vomiting, unspecified: Secondary | ICD-10-CM | POA: Insufficient documentation

## 2015-02-13 LAB — CBC WITH DIFFERENTIAL/PLATELET
BASOS ABS: 0 10*3/uL (ref 0.0–0.1)
BASOS PCT: 0 %
EOS PCT: 1 %
Eosinophils Absolute: 0.1 10*3/uL (ref 0.0–0.7)
HCT: 47 % (ref 39.0–52.0)
Hemoglobin: 15.4 g/dL (ref 13.0–17.0)
LYMPHS PCT: 20 %
Lymphs Abs: 1.5 10*3/uL (ref 0.7–4.0)
MCH: 26.3 pg (ref 26.0–34.0)
MCHC: 32.8 g/dL (ref 30.0–36.0)
MCV: 80.3 fL (ref 78.0–100.0)
Monocytes Absolute: 0.5 10*3/uL (ref 0.1–1.0)
Monocytes Relative: 7 %
Neutro Abs: 5.5 10*3/uL (ref 1.7–7.7)
Neutrophils Relative %: 72 %
PLATELETS: 239 10*3/uL (ref 150–400)
RBC: 5.85 MIL/uL — ABNORMAL HIGH (ref 4.22–5.81)
RDW: 13.5 % (ref 11.5–15.5)
WBC: 7.7 10*3/uL (ref 4.0–10.5)

## 2015-02-13 LAB — COMPREHENSIVE METABOLIC PANEL WITH GFR
ALT: 18 U/L (ref 17–63)
AST: 21 U/L (ref 15–41)
Albumin: 4.5 g/dL (ref 3.5–5.0)
Alkaline Phosphatase: 64 U/L (ref 38–126)
Anion gap: 9 (ref 5–15)
BUN: 11 mg/dL (ref 6–20)
CO2: 26 mmol/L (ref 22–32)
Calcium: 9.3 mg/dL (ref 8.9–10.3)
Chloride: 107 mmol/L (ref 101–111)
Creatinine, Ser: 1.08 mg/dL (ref 0.61–1.24)
GFR calc Af Amer: >60 mL/min
GFR calc non Af Amer: >60 mL/min
Glucose, Bld: 99 mg/dL (ref 65–99)
Potassium: 4.1 mmol/L (ref 3.5–5.1)
Sodium: 142 mmol/L (ref 135–145)
Total Bilirubin: 0.5 mg/dL (ref 0.3–1.2)
Total Protein: 7.4 g/dL (ref 6.5–8.1)

## 2015-02-13 LAB — LIPASE, BLOOD: Lipase: 21 U/L — ABNORMAL LOW (ref 22–51)

## 2015-02-13 MED ORDER — ONDANSETRON 8 MG PO TBDP
8.0000 mg | ORAL_TABLET | Freq: Once | ORAL | Status: AC
  Administered 2015-02-13: 8 mg via ORAL
  Filled 2015-02-13: qty 1

## 2015-02-13 MED ORDER — FLUTICASONE PROPIONATE 50 MCG/ACT NA SUSP: 2.0000 | Freq: Every day | NASAL | Status: DC

## 2015-02-13 NOTE — ED Provider Notes (Signed)
CSN: [id number]     Arrival date & time 02/13/15  1058 History   First MD Initiated Contact with Patient 02/13/15 1119     Chief Complaint  Patient presents with  . Cough    x 4 days  . Headache  . Nasal Congestion     (Consider location/radiation/quality/duration/timing/severity/associated sxs/prior Treatment) HPI Comments: 19 year old male presenting with a nonproductive cough, nasal congestion 4 days. Reports midsternal chest pain when coughing only rated 6/10 along with a headache when coughing. He feels pressure behind his eyes. Tried taking Alka-Seltzer and NyQuil with minimal relief. Yesterday, he had 2 episodes of nonbloody, nonbilious emesis after eating. He has not tried to eat anything today. Despite MSE note, patient denies abdominal pain. He has had intermittent diarrhea. Denies fever or wheezing. He works in [employer] and is frequently around other people.  Patient is a 19 y.o. male presenting with cough and headaches. The history is provided by the patient.  Cough Associated symptoms: chest pain (when coughing only) and headaches   Associated symptoms: no shortness of breath   Headache Associated symptoms: congestion, cough, diarrhea, nausea and vomiting     History reviewed. No pertinent past medical history. Past Surgical History  Procedure Laterality Date  . Wisdom tooth extraction     History reviewed. No pertinent family history. Social History  Substance Use Topics  . Smoking status: Never Smoker   . Smokeless tobacco: Never Used  . Alcohol Use: No    Review of Systems  HENT: Positive for congestion.   Respiratory: Positive for cough. Negative for shortness of breath.   Cardiovascular: Positive for chest pain (when coughing only).  Gastrointestinal: Positive for nausea, vomiting and diarrhea.  Neurological: Positive for headaches.  All other systems reviewed and are negative.     Allergies  Review of patient's allergies indicates no known  allergies.  Home Medications   Prior to Admission medications   Medication Sig Start Date End Date Taking? Authorizing Provider  cephALEXin (KEFLEX) 500 MG capsule Take 1 capsule (500 mg total) by mouth 4 (four) times daily. Patient not taking: Reported on 02/13/2015 12/14/14   Glynn Octave, MD  cephALEXin (KEFLEX) 500 MG capsule Take 1 capsule (500 mg total) by mouth 3 (three) times daily. Patient not taking: Reported on 02/13/2015 12/28/14   Tatyana Kirichenko, PA-C  fluticasone (FLONASE) 50 MCG/ACT nasal spray Place 2 sprays into both nostrils daily. 02/13/15   Robyn M Hess, PA-C  ibuprofen (ADVIL,MOTRIN) 800 MG tablet Take 1 tablet (800 mg total) by mouth 3 (three) times daily. Patient not taking: Reported on 02/13/2015 12/14/14   Glynn Octave, MD   BP 133/56 mmHg  Pulse 74  Temp(Src) 98.5 F (36.9 C) (Oral)  Resp 16  SpO2 96% Physical Exam  Constitutional: He is oriented to person, place, and time. He appears well-developed and well-nourished. No distress.  HENT:  Head: Normocephalic and atraumatic.  Nose: Right sinus exhibits maxillary sinus tenderness. Left sinus exhibits maxillary sinus tenderness.  Nasal congestion, mucosal edema, post nasal drip.  Eyes: Conjunctivae and EOM are normal.  Neck: Normal range of motion. Neck supple.  Cardiovascular: Normal rate, regular rhythm and normal heart sounds.   Pulmonary/Chest: Effort normal and breath sounds normal. No respiratory distress. He has no wheezes. He has no rhonchi.  Abdominal: Soft. Bowel sounds are normal. He exhibits no distension. There is no tenderness.  Musculoskeletal: Normal range of motion. He exhibits no edema.  Lymphadenopathy:    He has no cervical adenopathy.  Neurological: He is alert and oriented to person, place, and time.  Skin: Skin is warm and dry.  Psychiatric: He has a normal mood and affect. His behavior is normal.  Nursing note and vitals reviewed.   ED Course  Procedures (including critical  care time) Labs Review Labs Reviewed  CBC WITH DIFFERENTIAL/PLATELET - Abnormal; Notable for the following:    RBC 5.85 (*)    All other components within normal limits  LIPASE, BLOOD - Abnormal; Notable for the following:    Lipase 21 (*)    All other components within normal limits  COMPREHENSIVE METABOLIC PANEL    Imaging Review Dg Abd Acute W/chest  02/13/2015   CLINICAL DATA:  Cough, shortness of breath, nausea vomiting for 2 days.  EXAM: DG ABDOMEN ACUTE W/ 1V CHEST  COMPARISON:  None.  FINDINGS: There is no evidence of dilated bowel loops or free intraperitoneal air. No radiopaque calculi or other significant radiographic abnormality is seen. Heart size and mediastinal contours are within normal limits. Both lungs are clear.  IMPRESSION: Negative abdominal radiographs.  No acute cardiopulmonary disease.   Electronically Signed   By: Sherian Rein M.D.   On: 02/13/2015 12:51   I have personally reviewed and evaluated these images and lab results as part of my medical decision-making.   EKG Interpretation None      MDM   Final diagnoses:  URI (upper respiratory infection)  Cough   Nontoxic appearing, NAD. AF VSS. Had a workup ordered on initial MSE while in triage. No acute findings. On exam, lungs clear. Has nasal congestion and mucosal edema. Symptoms most likely coming from URI and sinus congestion. No vomiting today and is tolerating PO in the ED. Abdomen soft and nontender. Advised continued symptomatic treatment. Stable for discharge. Return precautions given. Patient states understanding of treatment care plan and is agreeable.  Kathrynn Speed, PA-C 02/13/15 1559  Courteney Randall An, MD 02/13/15 [phone]

## 2015-02-13 NOTE — ED Provider Notes (Signed)
MSE was initiated and I personally evaluated the patient and placed orders (if any) at  11:33 AM on February 13, 2015.  Kevin Sosa is a 19 y.o. male with who presents to the ED with complaints of dry cough, nasal congestion, chest pain with coughing which he describes as 6/10 intermittent sharp nonradiating pain worse with coughing, and unrelieved with NyQuil and Alka-Seltzer. His symptoms have been ongoing for 4 days. He also endorses an associated headache, as well as intermittent abdominal pain, diarrhea, and 2 episodes of nonbloody nonbilious emesis over the last 24 hours. He states that he has been unable to keep any food down in the last 2 days.  BP 132/75 mmHg  Pulse 92  Temp(Src) 98.1 F (36.7 C) (Oral)  Resp 18  SpO2 100%  On exam, some rhonchi without wheezing in the lower lung fields, abdominal exam with soft nontender nondistended abdomen, positive bowel sounds throughout, no rebound/guarding/rigidity.  Given his complaints, will obtain labs and acute abdominal series as well as give Zofran and moved to a acute bed.  The patient appears stable so that the remainder of the MSE may be completed by another provider.  Mercedes Camprubi-Soms, PA-C 02/13/15 1135  Courteney Randall An, MD 02/15/15 1643

## 2015-02-13 NOTE — Progress Notes (Signed)
CM spoke with pt who confirms uninsured [place] resident with no pcp.  CM discussed and provided written information for uninsured accepting pcps, discussed the importance of pcp vs EDP services for f/u care, www.needymeds.org, www.goodrx.com, discounted pharmacies and other [place] resources such as [facility] , [facility], affordable care act, financial assistance, uninsured dental services, [place] med assist, DSS and  health department  Reviewed resources for [place] uninsured accepting pcps like [facility], [facility] [address], [facility], [facility], local urgent care centers, [facility], [facility], general medical clinics, [facility], [facility] plus others, medication resources, [facility] out patient pharmacies and housing Pt voiced understanding and appreciation of resources provided   Provided [facility] contact information Pt agreed to a referral Cm completed referral Pt to be contact by [facility] clinical liason  

## 2015-02-13 NOTE — ED Notes (Signed)
Bed: WA20 Expected date:  Expected time:  Means of arrival:  Comments: Triage 7 

## 2015-02-13 NOTE — ED Notes (Signed)
Pt reports 4 day hx of cough, nasal congestion and headache. Pt reports that he found mold in his room, near his air vents. Currently tx with Alka -Seltzer cold meds. Denies wheezing

## 2015-02-13 NOTE — Discharge Instructions (Signed)
Use Flonase as prescribed. Stay well-hydrated. Continue over-the-counter medications.  Cough, Adult  A cough is a reflex that helps clear your throat and airways. It can help heal the body or may be a reaction to an irritated airway. A cough may only last 2 or 3 weeks (acute) or may last more than 8 weeks (chronic).  CAUSES Acute cough:  Viral or bacterial infections. Chronic cough:  Infections.  Allergies.  Asthma.  Post-nasal drip.  Smoking.  Heartburn or acid reflux.  Some medicines.  Chronic lung problems (COPD).  Cancer. SYMPTOMS   Cough.  Fever.  Chest pain.  Increased breathing rate.  High-pitched whistling sound when breathing (wheezing).  Colored mucus that you cough up (sputum). TREATMENT   A bacterial cough may be treated with antibiotic medicine.  A viral cough must run its course and will not respond to antibiotics.  Your caregiver may recommend other treatments if you have a chronic cough. HOME CARE INSTRUCTIONS   Only take over-the-counter or prescription medicines for pain, discomfort, or fever as directed by your caregiver. Use cough suppressants only as directed by your caregiver.  Use a cold steam vaporizer or humidifier in your bedroom or home to help loosen secretions.  Sleep in a semi-upright position if your cough is worse at night.  Rest as needed.  Stop smoking if you smoke. SEEK IMMEDIATE MEDICAL CARE IF:   You have pus in your sputum.  Your cough starts to worsen.  You cannot control your cough with suppressants and are losing sleep.  You begin coughing up blood.  You have difficulty breathing.  You develop pain which is getting worse or is uncontrolled with medicine.  You have a fever. MAKE SURE YOU:   Understand these instructions.  Will watch your condition.  Will get help right away if you are not doing well or get worse. Document Released: 11/08/2010 Document Revised: 08/04/2011 Document Reviewed:  11/08/2010 [facility] Patient Information 2015 [place], [place]. This information is not intended to replace advice given to you by your health care provider. Make sure you discuss any questions you have with your health care provider.   Upper Respiratory Infection, Adult An upper respiratory infection (URI) is also sometimes known as the common cold. The upper respiratory tract includes the nose, sinuses, throat, trachea, and bronchi. Bronchi are the airways leading to the lungs. Most people improve within 1 week, but symptoms can last up to 2 weeks. A residual cough may last even longer.  CAUSES Many different viruses can infect the tissues lining the upper respiratory tract. The tissues become irritated and inflamed and often become very moist. Mucus production is also common. A cold is contagious. You can easily spread the virus to others by oral contact. This includes kissing, sharing a glass, coughing, or sneezing. Touching your mouth or nose and then touching a surface, which is then touched by another person, can also spread the virus. SYMPTOMS  Symptoms typically develop 1 to 3 days after you come in contact with a cold virus. Symptoms vary from person to person. They may include:  Runny nose.  Sneezing.  Nasal congestion.  Sinus irritation.  Sore throat.  Loss of voice (laryngitis).  Cough.  Fatigue.  Muscle aches.  Loss of appetite.  Headache.  Low-grade fever. DIAGNOSIS  You might diagnose your own cold based on familiar symptoms, since most people get a cold 2 to 3 times a year. Your caregiver can confirm this based on your exam. Most importantly, your caregiver  can check that your symptoms are not due to another disease such as strep throat, sinusitis, pneumonia, asthma, or epiglottitis. Blood tests, throat tests, and X-rays are not necessary to diagnose a common cold, but they may sometimes be helpful in excluding other more serious diseases. Your caregiver will decide  if any further tests are required. RISKS AND COMPLICATIONS  You may be at risk for a more severe case of the common cold if you smoke cigarettes, have chronic heart disease (such as heart failure) or lung disease (such as asthma), or if you have a weakened immune system. The very young and very old are also at risk for more serious infections. Bacterial sinusitis, middle ear infections, and bacterial pneumonia can complicate the common cold. The common cold can worsen asthma and chronic obstructive pulmonary disease (COPD). Sometimes, these complications can require emergency medical care and may be life-threatening. PREVENTION  The best way to protect against getting a cold is to practice good hygiene. Avoid oral or hand contact with people with cold symptoms. Wash your hands often if contact occurs. There is no clear evidence that vitamin C, vitamin E, echinacea, or exercise reduces the chance of developing a cold. However, it is always recommended to get plenty of rest and practice good nutrition. TREATMENT  Treatment is directed at relieving symptoms. There is no cure. Antibiotics are not effective, because the infection is caused by a virus, not by bacteria. Treatment may include:  Increased fluid intake. Sports drinks offer valuable electrolytes, sugars, and fluids.  Breathing heated mist or steam (vaporizer or shower).  Eating chicken soup or other clear broths, and maintaining good nutrition.  Getting plenty of rest.  Using gargles or lozenges for comfort.  Controlling fevers with ibuprofen or acetaminophen as directed by your caregiver.  Increasing usage of your inhaler if you have asthma. Zinc gel and zinc lozenges, taken in the first 24 hours of the common cold, can shorten the duration and lessen the severity of symptoms. Pain medicines may help with fever, muscle aches, and throat pain. A variety of non-prescription medicines are available to treat congestion and runny nose. Your  caregiver can make recommendations and may suggest nasal or lung inhalers for other symptoms.  HOME CARE INSTRUCTIONS   Only take over-the-counter or prescription medicines for pain, discomfort, or fever as directed by your caregiver.  Use a warm mist humidifier or inhale steam from a shower to increase air moisture. This may keep secretions moist and make it easier to breathe.  Drink enough water and fluids to keep your urine clear or pale yellow.  Rest as needed.  Return to work when your temperature has returned to normal or as your caregiver advises. You may need to stay home longer to avoid infecting others. You can also use a face mask and careful hand washing to prevent spread of the virus. SEEK MEDICAL CARE IF:   After the first few days, you feel you are getting worse rather than better.  You need your caregiver's advice about medicines to control symptoms.  You develop chills, worsening shortness of breath, or brown or red sputum. These may be signs of pneumonia.  You develop yellow or brown nasal discharge or pain in the face, especially when you bend forward. These may be signs of sinusitis.  You develop a fever, swollen neck glands, pain with swallowing, or white areas in the back of your throat. These may be signs of strep throat. SEEK IMMEDIATE MEDICAL CARE IF:  You have a fever.  You develop severe or persistent headache, ear pain, sinus pain, or chest pain.  You develop wheezing, a prolonged cough, cough up blood, or have a change in your usual mucus (if you have chronic lung disease).  You develop sore muscles or a stiff neck. Document Released: 11/05/2000 Document Revised: 08/04/2011 Document Reviewed: 08/17/2013 [facility] Patient Information 2015 [place], [place]. This information is not intended to replace advice given to you by your health care provider. Make sure you discuss any questions you have with your health care provider.

## 2015-06-28 ENCOUNTER — Emergency Department (HOSPITAL_BASED_OUTPATIENT_CLINIC_OR_DEPARTMENT_OTHER)
Admission: EM | Admit: 2015-06-28 | Discharge: 2015-06-28 | Disposition: A | Discharge status: Home / Self Care | Payer: Medicaid Other | Attending: Emergency Medicine | Admitting: Emergency Medicine

## 2015-06-28 ENCOUNTER — Encounter (HOSPITAL_BASED_OUTPATIENT_CLINIC_OR_DEPARTMENT_OTHER): Payer: Self-pay

## 2015-06-28 DIAGNOSIS — L600 Ingrowing nail: Secondary | ICD-10-CM | POA: Insufficient documentation

## 2015-06-28 MED ORDER — CEPHALEXIN 500 MG PO CAPS: 500.0000 mg | ORAL_CAPSULE | Freq: Four times a day (QID) | ORAL | Status: DC

## 2015-06-28 MED ORDER — TRAMADOL HCL 50 MG PO TABS: 50.0000 mg | ORAL_TABLET | Freq: Four times a day (QID) | ORAL | Status: DC | PRN

## 2015-06-28 MED ORDER — LIDOCAINE HCL 2 % IJ SOLN
20.0000 mL | Freq: Once | INTRAMUSCULAR | Status: AC
  Administered 2015-06-28: 400 mg
  Filled 2015-06-28: qty 20

## 2015-06-28 MED FILL — CEPHALEXIN 500 MG CAPSULE: 500 | 5 days supply | Qty: 20 | Fill #0

## 2015-06-28 MED FILL — traMADol HCL 50 MG TABS: 50 | 4 days supply | Qty: 15 | Fill #0

## 2015-06-28 NOTE — Discharge Instructions (Signed)
Ingrown Toenail An ingrown toenail occurs when the corner or sides of your toenail grow into the surrounding skin. The big toe is most commonly affected, but it can happen to any of your toes. If your ingrown toenail is not treated, you will be at risk for infection. CAUSES This condition may be caused by:  Wearing shoes that are too small or tight.  Injury or trauma, such as stubbing your toe or having your toe stepped on.  Improper cutting or care of your toenails.  Being born with (congenital) nail or foot abnormalities, such as having a nail that is too big for your toe. RISK FACTORS Risk factors for an ingrown toenail include:  Age. Your nails tend to thicken as you get older, so ingrown nails are more common in older people.  Diabetes.  Cutting your toenails incorrectly.  Blood circulation problems. SYMPTOMS Symptoms may include:  Pain, soreness, or tenderness.  Redness.  Swelling.  Hardening of the skin surrounding the toe. Your ingrown toenail may be infected if there is fluid, pus, or drainage. DIAGNOSIS  An ingrown toenail may be diagnosed by medical history and physical exam. If your toenail is infected, your health care provider may test a sample of the drainage. TREATMENT Treatment depends on the severity of your ingrown toenail. Some ingrown toenails may be treated at home. More severe or infected ingrown toenails may require surgery to remove all or part of the nail. Infected ingrown toenails may also be treated with antibiotic medicines. HOME CARE INSTRUCTIONS  If you were prescribed an antibiotic medicine, finish all of it even if you start to feel better.  Soak your foot in warm soapy water for 20 minutes, 3 times per day or as directed by your health care provider.  Carefully lift the edge of the nail away from the sore skin by wedging a small piece of cotton under the corner of the nail. This may help with the pain. Be careful not to cause more injury  to the area.  Wear shoes that fit well. If your ingrown toenail is causing you pain, try wearing sandals, if possible.  Trim your toenails regularly and carefully. Do not cut them in a curved shape. Cut your toenails straight across. This prevents injury to the skin at the corners of the toenail.  Keep your feet clean and dry.  If you are having trouble walking and are given crutches by your health care provider, use them as directed.  Do not pick at your toenail or try to remove it yourself.  Take medicines only as directed by your health care provider.  Keep all follow-up visits as directed by your health care provider. This is important. SEEK MEDICAL CARE IF:  Your symptoms do not improve with treatment. SEEK IMMEDIATE MEDICAL CARE IF:  You have red streaks that start at your foot and go up your leg.  You have a fever.  You have increased redness, swelling, or pain.  You have fluid, blood, or pus coming from your toenail.   This information is not intended to replace advice given to you by your health care provider. Make sure you discuss any questions you have with your health care provider.   Document Released: 05/09/2000 Document Revised: 09/26/2014 Document Reviewed: 04/05/2014 Elsevier Interactive Patient Education [employer]  Please use medication as directed. Please follow-up with podiatry as previously instructed.

## 2015-06-28 NOTE — ED Provider Notes (Signed)
CSN: [id number]     Arrival date & time 06/28/15  1317 History   First MD Initiated Contact with Patient 06/28/15 1329     Chief Complaint  Patient presents with  . Toe Pain     HPI   20 year old male presents today with toe pain. Patient has a significant past medical history of ingrown toes. Reports proximally 6 months ago he be seen in the emergency room with toenail removal due to pain. He reports shortly after having toenail removed he started having pain again with the toenail ingrown. He reports he is supposed to follow up with podiatry but did not follow-up. He reports today that symptoms have persisted since last visit, reaching a peak today with pain to the lateral aspect of his rigth great toe. He denies any discharge, redness, significant swelling. Patient does note that he accidentally hits his toe he has minor bleeding from the toenail.  History reviewed. No pertinent past medical history. Past Surgical History  Procedure Laterality Date  . Wisdom tooth extraction     No family history on file. Social History  Substance Use Topics  . Smoking status: Never Smoker   . Smokeless tobacco: Never Used  . Alcohol Use: No    Review of Systems  All other systems reviewed and are negative.   Allergies  Review of patient's allergies indicates no known allergies.  Home Medications   Prior to Admission medications   Medication Sig Start Date End Date Taking? Authorizing Provider  cephALEXin (KEFLEX) 500 MG capsule Take 1 capsule (500 mg total) by mouth 4 (four) times daily. 06/28/15   Eyvonne Mechanic, PA-C  traMADol (ULTRAM) 50 MG tablet Take 1 tablet (50 mg total) by mouth every 6 (six) hours as needed. 06/28/15   Alexiz Sustaita, PA-C   BP 128/70 mmHg  Pulse 70  Temp(Src) 98.4 F (36.9 C) (Oral)  Resp 18  Ht  (1.676 m)  Wt 90.719 kg  BMI 32.30 kg/m2  SpO2 100%   Physical Exam  Constitutional: He is oriented to person, place, and time. He appears well-developed and  well-nourished.  HENT:  Head: Normocephalic and atraumatic.  Eyes: Conjunctivae are normal. Pupils are equal, round, and reactive to light. Right eye exhibits no discharge. Left eye exhibits no discharge. No scleral icterus.  Neck: Normal range of motion. No JVD present. No tracheal deviation present.  Pulmonary/Chest: Effort normal. No stridor.  Musculoskeletal:  Swelling noted to the distal right toe, ingrown nail on the medial aspect. Tender to palpation. No drainage.  Neurological: He is alert and oriented to person, place, and time. Coordination normal.  Psychiatric: He has a normal mood and affect. His behavior is normal. Judgment and thought content normal.  Nursing note and vitals reviewed.   ED Course  Procedures (including critical care time)   NAIL REMOVAL Date/Time: 4:15 PM 06/28/2015 Performed by: Jeralyn Bennett PA-C Authorized by: Thane Edu Consent: Verbal consent obtained. Consent given by: patient Patient identity confirmed: verbally with patient Location: right foot Anesthesia: digital block Local anesthetic: Lidocaine 2% without epinephrine Anesthetic total: 6 ml Patient sedated: no Preparation: skin prepped with Betadine Amount removed: partial Side: radial Wedge excision of skin of nail fold: yes Nail bed sutured: no Removed nail replaced and anchored: no Dressing: antibiotic ointment and gauze roll  Labs Review Labs Reviewed - No data to display  Imaging Review No results found. I have personally reviewed and evaluated these images and lab results as part of my medical decision-making.  EKG Interpretation None      MDM   Final diagnoses:  Ingrown toenail    Labs:  Imaging:  Consults:  Therapeutics:  lidocaine  Discharge Meds: Tramadol, Keflex  Assessment/Plan: 20 year old male presents today with ingrown toenail. He has no signs of infectious etiology of this toe, resection was done by myself. The ED with no complication.  Patient will be discharged home on antibiotics, and pain medication. Patient follow-up was emphasized again as this is a recurring issue and he needs to follow-up with podiatry for further evaluation and management. He is given strict return precautions and the event new worsening signs or symptoms present including signs of infection, patient verbalized understanding and agreement for today's plan had no further questions or concerns at time of discharge.         Eyvonne Mechanic, PA-C 06/28/15 1713  Lyndal Pulley, MD 06/28/15 [phone]

## 2015-06-28 NOTE — ED Notes (Signed)
Right great toe pain x 2 days-hx of ingrown toenail

## 2015-07-01 IMAGING — CR DG HAND COMPLETE 3+V*R*
3 series · 3 of 3 positions shown · non-contrast
Comparison: None.

CLINICAL DATA: injured his right middle finger playing basketball 3
days ago, has pains and swelling to right middle finger proximal IP
joint and down into the metacarpals, no other complaints

EXAM:
RIGHT HAND - COMPLETE 3+ VIEW

[x hand pa right]
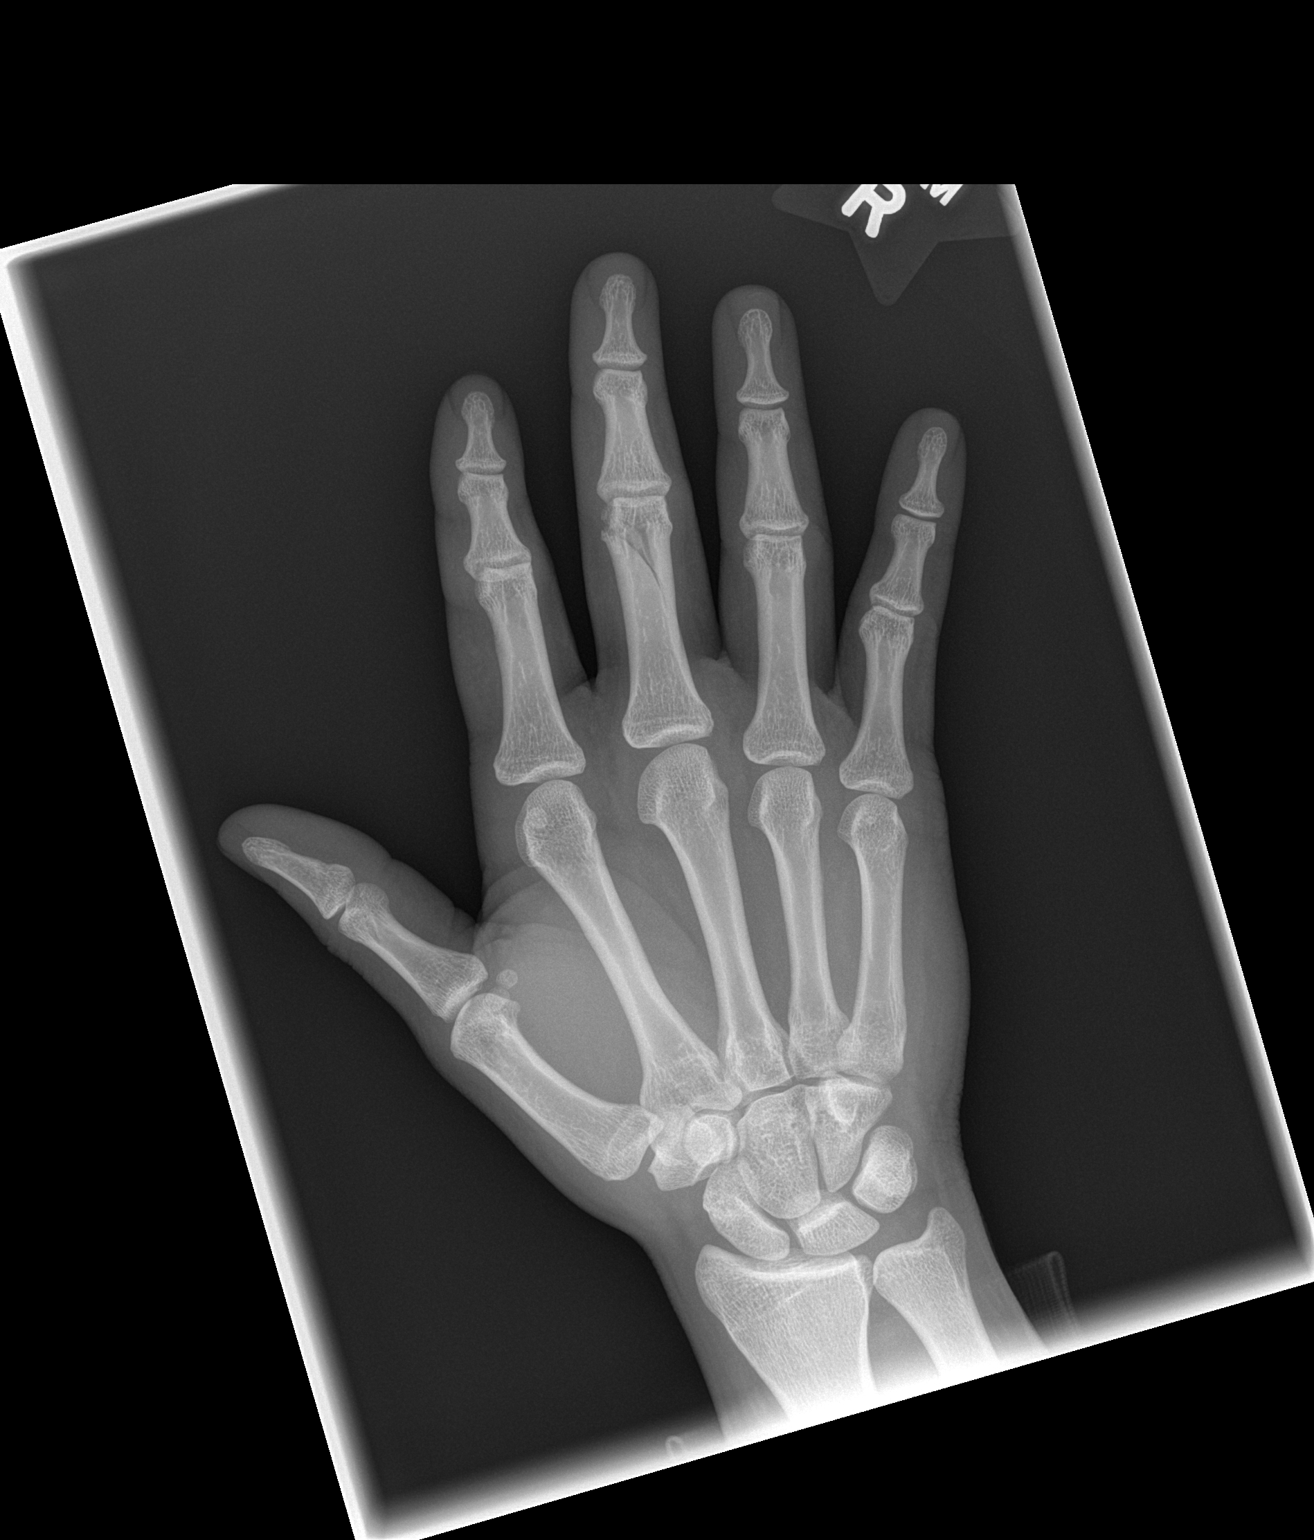

[x hand oblique right]
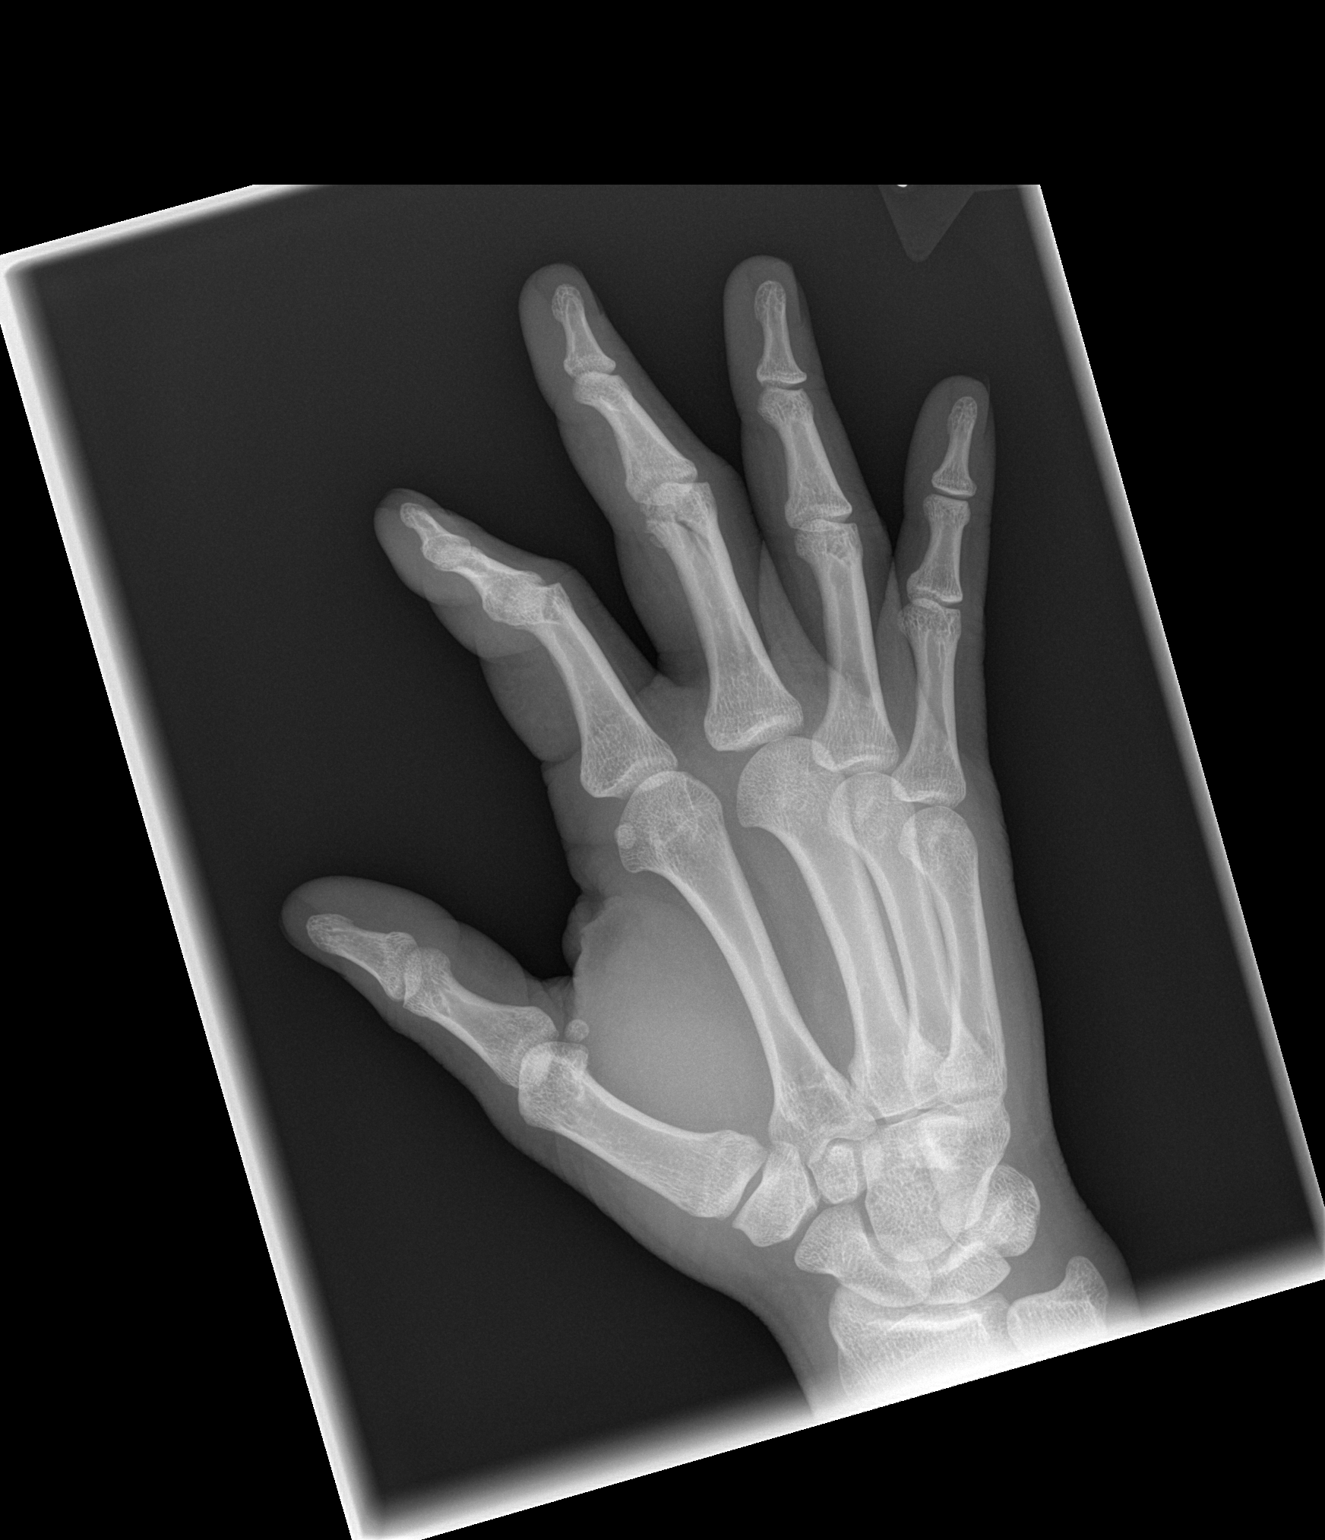

[x hand lat right]
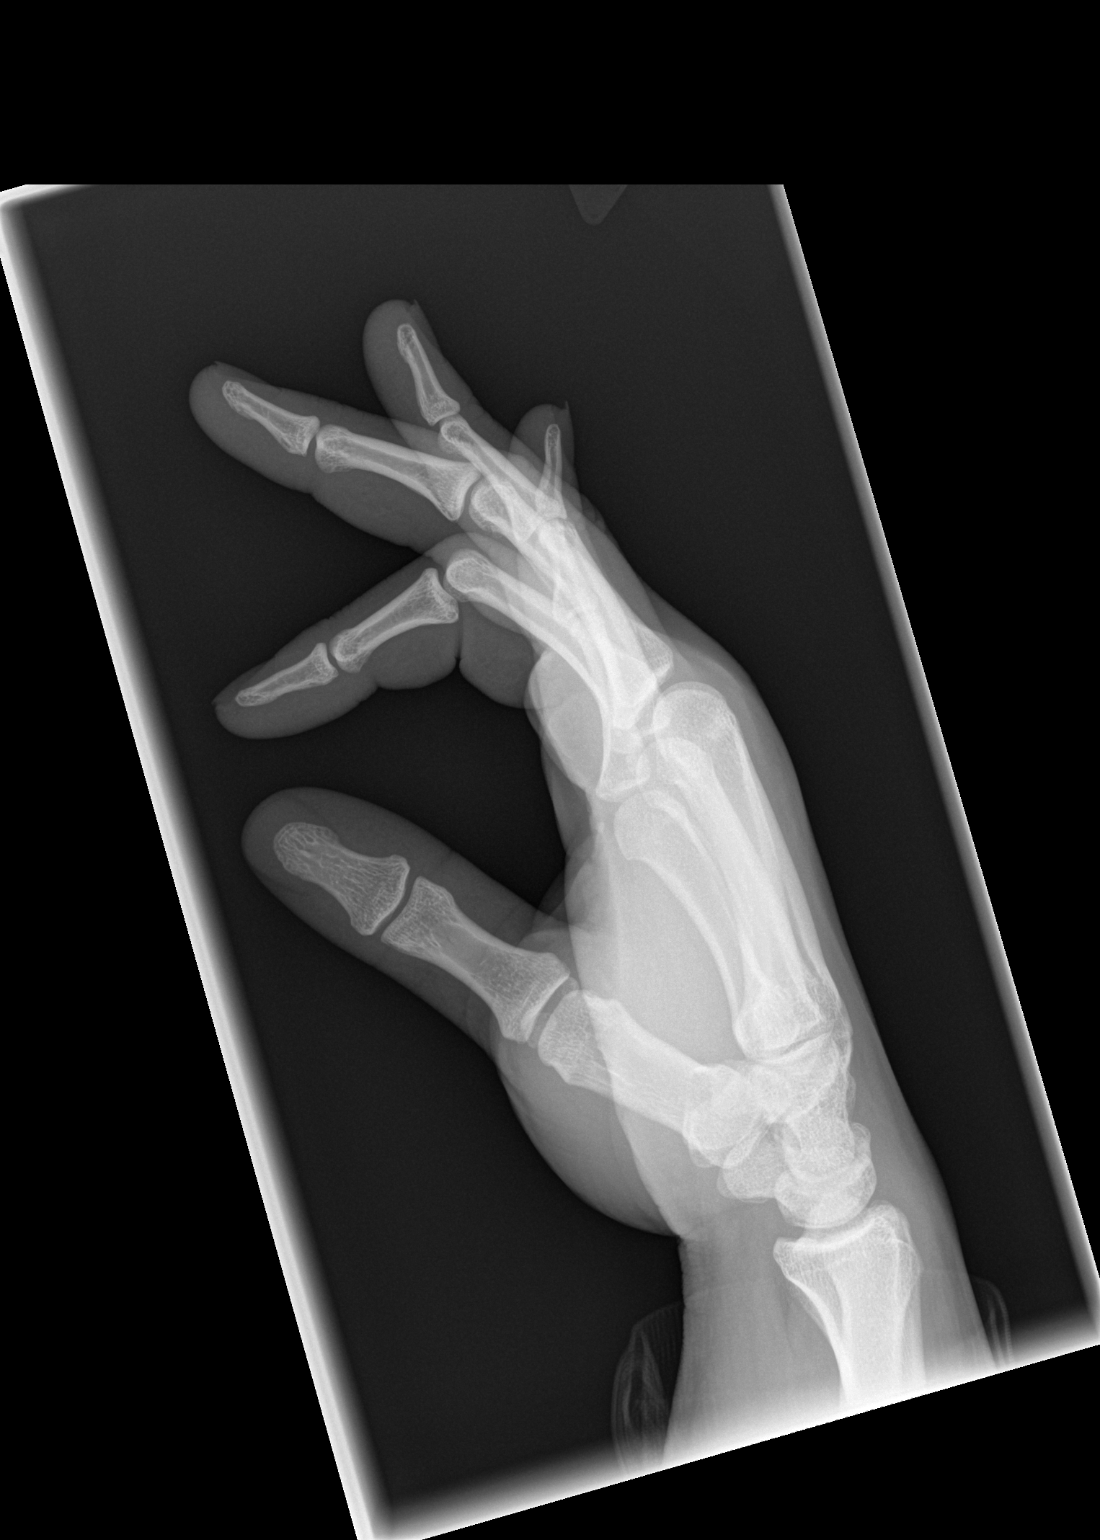

[3 of 3 positions shown; findings below may reference images not displayed]

FINDINGS: Oblique minimally comminuted fracture of the distal shaft proximal
phalanx right long finger, distracted less than 2 mm. There is
extension to the distal subchondral cortex suggested on the oblique
and lateral radiograph, but no step-off deformity. Normal alignment
and mineralization. No other acute bone abnormalities. No
significant osseous degenerative change.
IMPRESSION: 1. Oblique minimally comminuted fracture, proximal phalanx right
long finger.

## 2016-02-15 ENCOUNTER — Emergency Department (HOSPITAL_BASED_OUTPATIENT_CLINIC_OR_DEPARTMENT_OTHER): Payer: Self-pay

## 2016-02-15 ENCOUNTER — Emergency Department (HOSPITAL_BASED_OUTPATIENT_CLINIC_OR_DEPARTMENT_OTHER)
Admission: EM | Admit: 2016-02-15 | Discharge: 2016-02-15 | Disposition: A | Discharge status: Home / Self Care | Payer: Self-pay | Attending: Emergency Medicine | Admitting: Emergency Medicine

## 2016-02-15 ENCOUNTER — Encounter (HOSPITAL_BASED_OUTPATIENT_CLINIC_OR_DEPARTMENT_OTHER): Payer: Self-pay | Admitting: Emergency Medicine

## 2016-02-15 DIAGNOSIS — R197 Diarrhea, unspecified: Secondary | ICD-10-CM | POA: Insufficient documentation

## 2016-02-15 DIAGNOSIS — K429 Umbilical hernia without obstruction or gangrene: Secondary | ICD-10-CM | POA: Insufficient documentation

## 2016-02-15 DIAGNOSIS — K602 Anal fissure, unspecified: Secondary | ICD-10-CM | POA: Insufficient documentation

## 2016-02-15 LAB — COMPREHENSIVE METABOLIC PANEL
ALBUMIN: 4.5 g/dL (ref 3.5–5.0)
ALT: 39 U/L (ref 17–63)
ANION GAP: 8 (ref 5–15)
AST: 30 U/L (ref 15–41)
Alkaline Phosphatase: 58 U/L (ref 38–126)
BILIRUBIN TOTAL: 0.5 mg/dL (ref 0.3–1.2)
BUN: 16 mg/dL (ref 6–20)
CHLORIDE: 106 mmol/L (ref 101–111)
CO2: 25 mmol/L (ref 22–32)
Calcium: 9.2 mg/dL (ref 8.9–10.3)
Creatinine, Ser: 1.14 mg/dL (ref 0.61–1.24)
GFR calc Af Amer: >60 mL/min (ref >60)
GFR calc non Af Amer: >60 mL/min (ref >60)
GLUCOSE: 101 mg/dL — AB (ref 65–99)
POTASSIUM: 3.6 mmol/L (ref 3.5–5.1)
SODIUM: 139 mmol/L (ref 135–145)
TOTAL PROTEIN: 7.3 g/dL (ref 6.5–8.1)

## 2016-02-15 LAB — URINALYSIS, ROUTINE W REFLEX MICROSCOPIC
Bilirubin Urine: NEGATIVE
GLUCOSE, UA: NEGATIVE mg/dL
HGB URINE DIPSTICK: NEGATIVE
Ketones, ur: NEGATIVE mg/dL
LEUKOCYTES UA: NEGATIVE
Nitrite: NEGATIVE
PH: 7 (ref 5.0–8.0)
PROTEIN: NEGATIVE mg/dL
SPECIFIC GRAVITY, URINE: 1.017 (ref 1.005–1.030)

## 2016-02-15 LAB — CBC WITH DIFFERENTIAL/PLATELET
BASOS ABS: 0 10*3/uL (ref 0.0–0.1)
BASOS PCT: 1 %
EOS ABS: 0.1 10*3/uL (ref 0.0–0.7)
Eosinophils Relative: 2 %
HEMATOCRIT: 47.5 % (ref 39.0–52.0)
Hemoglobin: 16.2 g/dL (ref 13.0–17.0)
Lymphocytes Relative: 34 %
Lymphs Abs: 2.2 10*3/uL (ref 0.7–4.0)
MCH: 26.2 pg (ref 26.0–34.0)
MCHC: 34.1 g/dL (ref 30.0–36.0)
MCV: 76.7 fL — ABNORMAL LOW (ref 78.0–100.0)
MONO ABS: 0.5 10*3/uL (ref 0.1–1.0)
MONOS PCT: 7 %
NEUTROS ABS: 3.7 10*3/uL (ref 1.7–7.7)
NEUTROS PCT: 56 %
PLATELETS: 257 10*3/uL (ref 150–400)
RBC: 6.19 MIL/uL — ABNORMAL HIGH (ref 4.22–5.81)
RDW: 13.4 % (ref 11.5–15.5)
WBC: 6.5 10*3/uL (ref 4.0–10.5)

## 2016-02-15 LAB — OCCULT BLOOD X 1 CARD TO LAB, STOOL: Fecal Occult Bld: NEGATIVE

## 2016-02-15 MED ORDER — GI COCKTAIL ~~LOC~~
30.0000 mL | Freq: Once | ORAL | Status: AC
  Administered 2016-02-15: 30 mL via ORAL
  Filled 2016-02-15: qty 30

## 2016-02-15 MED ORDER — DICYCLOMINE HCL 20 MG PO TABS: 20.0000 mg | ORAL_TABLET | Freq: Two times a day (BID) | ORAL | 0 refills | Status: DC

## 2016-02-15 MED ORDER — KETOROLAC TROMETHAMINE 30 MG/ML IJ SOLN
30.0000 mg | Freq: Once | INTRAMUSCULAR | Status: AC
  Administered 2016-02-15: 30 mg via INTRAVENOUS
  Filled 2016-02-15: qty 1

## 2016-02-15 MED ORDER — DICYCLOMINE HCL 10 MG/ML IM SOLN
20.0000 mg | Freq: Once | INTRAMUSCULAR | Status: AC
  Administered 2016-02-15: 20 mg via INTRAMUSCULAR
  Filled 2016-02-15: qty 2

## 2016-02-15 MED ORDER — SODIUM CHLORIDE 0.9 % IV BOLUS (SEPSIS)
500.0000 mL | Freq: Once | INTRAVENOUS | Status: AC
  Administered 2016-02-15: 500 mL via INTRAVENOUS

## 2016-02-15 MED ORDER — IOPAMIDOL (ISOVUE-300) INJECTION 61%
100.0000 mL | Freq: Once | INTRAVENOUS | Status: AC | PRN
  Administered 2016-02-15: 100 mL via INTRAVENOUS

## 2016-02-15 NOTE — ED Notes (Signed)
[facility] [place] who was updated on pt condition and labs, no new recommendations at this time

## 2016-02-15 NOTE — ED Triage Notes (Signed)
Patient states that he has had pain to his back x 1 week. Patient reports that today it is worse

## 2016-02-15 NOTE — ED Notes (Signed)
[facility]  W/ diff occult stool card obtained and sent to lab.

## 2016-02-15 NOTE — ED Provider Notes (Signed)
[facility]   CSN: [id number] Arrival date & time: 02/15/16  0105     History   Chief Complaint Chief Complaint  Patient presents with  . Back Pain    HPI Kevin DonathKreshaun Kukla is a 20 y.o. male.  The history is provided by the patient.  Rectal Bleeding  Quality:  Bright red Amount:  Scant Timing:  Intermittent Chronicity:  New Context: defecation and diarrhea   Context: not foreign body   Relieved by:  Nothing Worsened by:  Nothing Ineffective treatments:  None tried Associated symptoms: no abdominal pain and no fever   Risk factors: no anticoagulant use and no NSAID use   Patient states 8 days ago he was brushing his teeth with peroxide (he states out of a brown bottle).  He swallowed some and began vomiting.  Called EMS and [facility] gave him maalox and zofran. He states he is concerned someone put bleach in his peroxide as he is now having diarrhea 4 times a day with BRB on the tissue and in the commode.  No f/c/r.    History reviewed. No pertinent past medical history.  There are no active problems to display for this patient.   History reviewed. No pertinent surgical history.     Home Medications    Prior to Admission medications   Not on File    Family History History reviewed. No pertinent family history.  Social History Social History  Substance Use Topics  . Smoking status: Never Smoker  . Smokeless tobacco: Never Used  . Alcohol use No     Allergies   Review of patient's allergies indicates no known allergies.   Review of Systems Review of Systems  Constitutional: Negative for fever.  HENT: Negative for drooling, sore throat, trouble swallowing and voice change.   Respiratory: Negative for cough, choking and shortness of breath.   Cardiovascular: Negative for chest pain, palpitations and leg swelling.  Gastrointestinal: Positive for anal bleeding, diarrhea and hematochezia. Negative for abdominal  pain.  Genitourinary: Negative for dysuria.  All other systems reviewed and are negative.    Physical Exam Updated Vital Signs BP 130/75 (BP Location: Right Arm)   Pulse 93   Temp 98.3 F (36.8 C) (Oral)   Resp 18   Ht 5\' 7"  (1.702 m)   Wt 190 lb (86.2 kg)   SpO2 100%   BMI 29.76 kg/m   Physical Exam  Constitutional: He is oriented to person, place, and time. He appears well-developed and well-nourished.  Well appearing sitting in the room  HENT:  Head: Normocephalic and atraumatic.  Nose: Nose normal.  Mouth/Throat: Oropharynx is clear and moist. No oropharyngeal exudate.  Eyes: Conjunctivae and EOM are normal. Pupils are equal, round, and reactive to light.  Neck: Normal range of motion. Neck supple. No tracheal deviation present.  No crepitance  Cardiovascular: Normal rate and regular rhythm.   Pulmonary/Chest: Effort normal and breath sounds normal. No stridor. No respiratory distress. He has no wheezes. He has no rales. He exhibits no tenderness.  No crepitance  Abdominal: Soft. Bowel sounds are normal. He exhibits no mass. There is no tenderness. There is no rebound and no guarding.  Genitourinary: Rectal exam shows guaiac negative stool.  Genitourinary Comments: Small anal fissure at the 6 oclock position  Musculoskeletal: Normal range of motion.  Lymphadenopathy:    He has no cervical adenopathy.  Neurological: He is alert and oriented to person, place, and time. He has  normal reflexes.  Skin: Skin is warm and dry. Capillary refill takes less than 2 seconds.  Psychiatric: He has a normal mood and affect.     ED Treatments / Results   Vitals:   02/15/16 0114  BP: 130/75  Pulse: 93  Resp: 18  Temp: 98.3 F (36.8 C)   Results for orders placed or performed during the hospital encounter of 02/15/16  CBC with Differential/Platelet  Result Value Ref Range   WBC 6.5 4.0 - 10.5 K/uL   RBC 6.19 (H) 4.22 - 5.81 MIL/uL   Hemoglobin 16.2 13.0 - 17.0 g/dL   HCT  09.8 11.9 - 14.7 %   MCV 76.7 (L) 78.0 - 100.0 fL   MCH 26.2 26.0 - 34.0 pg   MCHC 34.1 30.0 - 36.0 g/dL   RDW 82.9 56.2 - 13.0 %   Platelets 257 150 - 400 K/uL   Neutrophils Relative % 56 %   Neutro Abs 3.7 1.7 - 7.7 K/uL   Lymphocytes Relative 34 %   Lymphs Abs 2.2 0.7 - 4.0 K/uL   Monocytes Relative 7 %   Monocytes Absolute 0.5 0.1 - 1.0 K/uL   Eosinophils Relative 2 %   Eosinophils Absolute 0.1 0.0 - 0.7 K/uL   Basophils Relative 1 %   Basophils Absolute 0.0 0.0 - 0.1 K/uL  Comprehensive metabolic panel  Result Value Ref Range   Sodium 139 135 - 145 mmol/L   Potassium 3.6 3.5 - 5.1 mmol/L   Chloride 106 101 - 111 mmol/L   CO2 25 22 - 32 mmol/L   Glucose, Bld 101 (H) 65 - 99 mg/dL   BUN 16 6 - 20 mg/dL   Creatinine, Ser 8.65 0.61 - 1.24 mg/dL   Calcium 9.2 8.9 - 78.4 mg/dL   Total Protein 7.3 6.5 - 8.1 g/dL   Albumin 4.5 3.5 - 5.0 g/dL   AST 30 15 - 41 U/L   ALT 39 17 - 63 U/L   Alkaline Phosphatase 58 38 - 126 U/L   Total Bilirubin 0.5 0.3 - 1.2 mg/dL   GFR calc non Af Amer >60 >60 mL/min   GFR calc Af Amer >60 >60 mL/min   Anion gap 8 5 - 15  Urinalysis, Routine w reflex microscopic (not at [facility])  Result Value Ref Range   Color, Urine YELLOW YELLOW   APPearance CLEAR CLEAR   Specific Gravity, Urine 1.017 1.005 - 1.030   pH 7.0 5.0 - 8.0   Glucose, UA NEGATIVE NEGATIVE mg/dL   Hgb urine dipstick NEGATIVE NEGATIVE   Bilirubin Urine NEGATIVE NEGATIVE   Ketones, ur NEGATIVE NEGATIVE mg/dL   Protein, ur NEGATIVE NEGATIVE mg/dL   Nitrite NEGATIVE NEGATIVE   Leukocytes, UA NEGATIVE NEGATIVE  Occult blood card to lab, stool  Result Value Ref Range   Fecal Occult Bld NEGATIVE NEGATIVE   No results found.  Radiology No results found.  Procedures Procedures (including critical care time)  Medications Ordered in ED Medications  dicyclomine (BENTYL) injection 20 mg (not administered)  ketorolac (TORADOL) 30 MG/ML injection 30 mg (not administered)  gi cocktail  (Maalox,Lidocaine,Donnatal) (not administered)  sodium chloride 0.9 % bolus 500 mL (not administered)    Results for orders placed or performed during the hospital encounter of 02/15/16  CBC with Differential/Platelet  Result Value Ref Range   WBC 6.5 4.0 - 10.5 K/uL   RBC 6.19 (H) 4.22 - 5.81 MIL/uL   Hemoglobin 16.2 13.0 - 17.0 g/dL   HCT 69.6 29.5 -  52.0 %   MCV 76.7 (L) 78.0 - 100.0 fL   MCH 26.2 26.0 - 34.0 pg   MCHC 34.1 30.0 - 36.0 g/dL   RDW 40.9 81.1 - 91.4 %   Platelets 257 150 - 400 K/uL   Neutrophils Relative % 56 %   Neutro Abs 3.7 1.7 - 7.7 K/uL   Lymphocytes Relative 34 %   Lymphs Abs 2.2 0.7 - 4.0 K/uL   Monocytes Relative 7 %   Monocytes Absolute 0.5 0.1 - 1.0 K/uL   Eosinophils Relative 2 %   Eosinophils Absolute 0.1 0.0 - 0.7 K/uL   Basophils Relative 1 %   Basophils Absolute 0.0 0.0 - 0.1 K/uL  Comprehensive metabolic panel  Result Value Ref Range   Sodium 139 135 - 145 mmol/L   Potassium 3.6 3.5 - 5.1 mmol/L   Chloride 106 101 - 111 mmol/L   CO2 25 22 - 32 mmol/L   Glucose, Bld 101 (H) 65 - 99 mg/dL   BUN 16 6 - 20 mg/dL   Creatinine, Ser 7.82 0.61 - 1.24 mg/dL   Calcium 9.2 8.9 - 95.6 mg/dL   Total Protein 7.3 6.5 - 8.1 g/dL   Albumin 4.5 3.5 - 5.0 g/dL   AST 30 15 - 41 U/L   ALT 39 17 - 63 U/L   Alkaline Phosphatase 58 38 - 126 U/L   Total Bilirubin 0.5 0.3 - 1.2 mg/dL   GFR calc non Af Amer >60 >60 mL/min   GFR calc Af Amer >60 >60 mL/min   Anion gap 8 5 - 15  Urinalysis, Routine w reflex microscopic (not at [facility])  Result Value Ref Range   Color, Urine YELLOW YELLOW   APPearance CLEAR CLEAR   Specific Gravity, Urine 1.017 1.005 - 1.030   pH 7.0 5.0 - 8.0   Glucose, UA NEGATIVE NEGATIVE mg/dL   Hgb urine dipstick NEGATIVE NEGATIVE   Bilirubin Urine NEGATIVE NEGATIVE   Ketones, ur NEGATIVE NEGATIVE mg/dL   Protein, ur NEGATIVE NEGATIVE mg/dL   Nitrite NEGATIVE NEGATIVE   Leukocytes, UA NEGATIVE NEGATIVE  Occult blood card to lab, stool    Result Value Ref Range   Fecal Occult Bld NEGATIVE NEGATIVE   Dg Neck Soft Tissue  Result Date: 02/15/2016 CLINICAL DATA:  Accidental consumption of bleach. Low left back pain with nose bleeds and bloody stool. EXAM: NECK SOFT TISSUES - 1+ VIEW COMPARISON:  None. FINDINGS: There is no evidence of retropharyngeal soft tissue swelling or epiglottic enlargement. The cervical airway is unremarkable and no radio-opaque foreign body identified. IMPRESSION: Negative. Electronically Signed   By: Burman Nieves M.D.   On: 02/15/2016 05:35   Ct Abdomen Pelvis W Contrast  Result Date: 02/15/2016 CLINICAL DATA:  Initial evaluation for possible obstruction on previous radiograph performed earlier the same day. EXAM: CT ABDOMEN AND PELVIS WITH CONTRAST TECHNIQUE: Multidetector CT imaging of the abdomen and pelvis was performed using the standard protocol following bolus administration of intravenous contrast. CONTRAST:  ISOVUE-300 IOPAMIDOL (ISOVUE-300) INJECTION 61% COMPARISON:  Prior radiograph from earlier same day. FINDINGS: Lower chest: Visualized lung bases are clear. Hepatobiliary: Liver within normal limits. Gallbladder normal. No biliary dilatation. Pancreas: Pancreas normal. Spleen: Spleen is normal.  The Adrenals/Urinary Tract: Adrenal glands are normal. Kidneys equal in size with symmetric enhancement. No nephrolithiasis, hydronephrosis, or focal enhancing renal mass. Ureters within normal limits. Bladder normal. Stomach/Bowel: Stomach within normal limits. Small bowel of normal caliber without evidence for obstruction or acute inflammation. Appendix  is normal. Colon largely decompressed without acute abnormality. No acute findings to correlate with previously questioned abnormality on prior radiograph. Vascular/Lymphatic: Normal intravascular enhancement seen throughout the intra-abdominal aorta. No adenopathy. Reproductive: Prostate normal. Other: No free air or fluid. Small fat containing  paraumbilical hernia noted. Musculoskeletal: Mild scoliosis. No acute osseous abnormality. No worrisome lytic or blastic osseous lesions. Partial sacralization of L5 on the right. IMPRESSION: No CT evidence for acute intra-abdominal or pelvic process identified. No abnormality seen to correlate with previously questioned findings on prior radiograph. Electronically Signed   By: Rise Mu M.D.   On: 02/15/2016 06:44   Dg Abdomen Acute W/chest  Result Date: 02/15/2016 CLINICAL DATA:  Accidentally consumption of bleach. Left low back pain, nose bleeds, and bloody stool EXAM: DG ABDOMEN ACUTE W/ 1V CHEST COMPARISON:  None. FINDINGS: Normal heart size and pulmonary vascularity. No focal airspace disease or consolidation in the lungs. No blunting of costophrenic angles. No pneumothorax. Mediastinal contours appear intact. Scattered gas and stool throughout the colon with some gas and air-fluid levels in the small bowel. No obvious small bowel distention. Can't exclude obstruction with predominantly fluid-filled small bowel loops. No free intra-abdominal air. No radiopaque stones peer last bones appear intact. IMPRESSION: No evidence of active pulmonary disease. Gas and fluid in the small bowel. No obvious bowel distention but can't exclude obstruction with predominantly fluid-filled small bowel loops. No evidence of perforation. Electronically Signed   By: Burman Nieves M.D.   On: 02/15/2016 05:37    Initial Impression / Assessment and Plan / ED Course  I have reviewed the triage vital signs and the nursing notes.  Pertinent labs & imaging results that were available during my care of the patient were reviewed by me and considered in my medical decision making (see chart for details).  Clinical Course    See nurses note for position control recommendations.  I do not believe he ingested bleach as it can cause liquefactive necrosis of the esophagus and the patient would appear toxic and would  like he extremely ill at this point and he is well appearing without any vitals sign or exam abnormalities.  Hemoccult is negative and H/H is normal.  If H/H is normal they recommend   Final Clinical Impressions(s) / ED Diagnoses   There is no sign of perforation of a viscus.  Patient is repeatedly coming up with new complaints that have no foundation.  I highly doubt the patient drank bleach as he is so well appearing without vital sign or lab derangement.   Follow up with GI as an outpatient.    New Prescriptions New Prescriptions   No medications on file  All questions answered to patient's satisfaction. Based on history and exam patient has been appropriately medically screened and emergency conditions excluded. Patient is stable for discharge at this time. Follow up with your PMD for recheck in 2 days and strict return precautions given   Dierre Crevier, MD 02/15/16 1610

## 2016-02-15 NOTE — ED Notes (Signed)
Pt reports event of accidental consumption of bleach while brushing teeth. C/o low left back pain occassional nosebleeding and 1 bloody stool. Pt further states that he was treated at [facility] on 02/08/2016 and was released. Is currently having lose stool  Back and abd pain.

## 2016-02-16 ENCOUNTER — Emergency Department (HOSPITAL_BASED_OUTPATIENT_CLINIC_OR_DEPARTMENT_OTHER)
Admission: EM | Admit: 2016-02-16 | Discharge: 2016-02-16 | Disposition: A | Discharge status: Home / Self Care | Payer: Medicaid Other | Attending: Emergency Medicine | Admitting: Emergency Medicine

## 2016-02-16 ENCOUNTER — Encounter (HOSPITAL_BASED_OUTPATIENT_CLINIC_OR_DEPARTMENT_OTHER): Payer: Self-pay | Admitting: *Deleted

## 2016-02-16 DIAGNOSIS — T07XXXA Unspecified multiple injuries, initial encounter: Secondary | ICD-10-CM

## 2016-02-16 DIAGNOSIS — Y999 Unspecified external cause status: Secondary | ICD-10-CM | POA: Insufficient documentation

## 2016-02-16 DIAGNOSIS — Y939 Activity, unspecified: Secondary | ICD-10-CM | POA: Insufficient documentation

## 2016-02-16 DIAGNOSIS — S60512A Abrasion of left hand, initial encounter: Secondary | ICD-10-CM | POA: Insufficient documentation

## 2016-02-16 DIAGNOSIS — S60511A Abrasion of right hand, initial encounter: Secondary | ICD-10-CM | POA: Insufficient documentation

## 2016-02-16 DIAGNOSIS — Y929 Unspecified place or not applicable: Secondary | ICD-10-CM | POA: Insufficient documentation

## 2016-02-16 DIAGNOSIS — W2201XA Walked into wall, initial encounter: Secondary | ICD-10-CM | POA: Insufficient documentation

## 2016-02-16 MED ORDER — IBUPROFEN 800 MG PO TABS
800.0000 mg | ORAL_TABLET | Freq: Once | ORAL | Status: AC
  Administered 2016-02-16: 800 mg via ORAL
  Filled 2016-02-16: qty 1

## 2016-02-16 NOTE — ED Triage Notes (Signed)
Hit wall w bilateral hands till they went numb,  Abrasions to both hands and pain

## 2016-02-16 NOTE — ED Provider Notes (Signed)
[place] DEPT [facility] Provider Note   CSN: [id number] Arrival date & time: 02/16/16  0404     History   Chief Complaint Chief Complaint  Patient presents with  . Hand Pain    HPI Kevin Sosa is a 20 y.o. male.  The history is provided by the patient.  Hand Pain  This is a new problem. The current episode started 6 to 12 hours ago. The problem occurs constantly. The problem has not changed since onset.Pertinent negatives include no chest pain, no abdominal pain, no headaches and no shortness of breath. Nothing aggravates the symptoms. Nothing relieves the symptoms. He has tried nothing for the symptoms. The treatment provided no relief.  Patient is now well known to me.  I saw yesterday morning for a myriad of complaints.  Today, he reportedly became angry about something an punches a wall with both fists. Went to [facility] where he had Xrays of both hands but did not stay to be seen and walked out of there and then came straight here.  Patient states tetanus is UTD.   There are not records under this ID because patient was entered incorrectly yesterday and my previous note is under another MR.  States marked for merge   History reviewed. No pertinent past medical history.  There are no active problems to display for this patient.   Past Surgical History:  Procedure Laterality Date  . WISDOM TOOTH EXTRACTION        Home Medications    Prior to Admission medications   Medication Sig Start Date End Date Taking? Authorizing Provider  cephALEXin (KEFLEX) 500 MG capsule Take 1 capsule (500 mg total) by mouth 4 (four) times daily. 06/28/15   Okey Regal, PA-C  traMADol (ULTRAM) 50 MG tablet Take 1 tablet (50 mg total) by mouth every 6 (six) hours as needed. 06/28/15   Okey Regal, PA-C    Family History No family history on file.  Social History Social History  Substance Use Topics  . Smoking status: Never Smoker  . Smokeless tobacco: Never Used  . Alcohol use No      Allergies   Review of patient's allergies indicates no known allergies.   Review of Systems Review of Systems  Respiratory: Negative for shortness of breath.   Cardiovascular: Negative for chest pain.  Gastrointestinal: Negative for abdominal pain.  Musculoskeletal: Positive for arthralgias.  Neurological: Negative for tremors, seizures, syncope, speech difficulty, weakness, light-headedness, numbness and headaches.  All other systems reviewed and are negative.    Physical Exam Updated Vital Signs BP 150/100 (BP Location: Right Arm)   Pulse 94   Temp 99.2 F (37.3 C) (Oral)   Ht '5\' 6"'$  (1.676 m)   Wt 200 lb (90.7 kg)   SpO2 100%   BMI 32.28 kg/m   Physical Exam  Constitutional: He appears well-developed and well-nourished. No distress.  HENT:  Head: Normocephalic and atraumatic.  Mouth/Throat: No oropharyngeal exudate.  Eyes: EOM are normal. Pupils are equal, round, and reactive to light.  Neck: Normal range of motion. Neck supple.  Cardiovascular: Regular rhythm, normal heart sounds and intact distal pulses.   Pulmonary/Chest: Effort normal and breath sounds normal.  Abdominal: Soft. Bowel sounds are normal. He exhibits no mass. There is no tenderness. There is no guarding.  Musculoskeletal: Normal range of motion. He exhibits no deformity.       Right wrist: Normal.       Left wrist: Normal.       Right hand:  He exhibits normal range of motion, no tenderness, no bony tenderness, normal two-point discrimination, normal capillary refill and no deformity. Normal sensation noted. Normal strength noted.       Left hand: He exhibits normal range of motion, no tenderness, no bony tenderness, normal capillary refill and no deformity. Normal sensation noted. Normal strength noted.       Hands: Neurological: He is alert. He has normal reflexes.  Skin: Skin is warm and dry. Capillary refill takes less than 2 seconds.     ED Treatments / Results   Vitals:   02/16/16  0414 02/16/16 0417  BP: 150/100   Pulse: 94   Temp:  99.2 F (37.3 C)    Radiology See care everywhere, hand Xrays read by Dr. Radene Knee of [facility] radiology and both hands are normal without fractures.   Procedures Procedures (including critical care time)  Medications Ordered in ED Medications  ibuprofen (ADVIL,MOTRIN) tablet 800 mg (not administered)    Wound care and bandaging of abrasions.  Ice bag applied.  Patient informed that [facility] radiologists read for [facility] and I have reviewed the results of B hand xrays which were negative for fractures.    Initial Impression / Assessment and Plan / ED Course  I have reviewed the triage vital signs and the nursing notes.  Pertinent labs & imaging results that were available during my care of the patient were reviewed by me and considered in my medical decision making (see chart for details).   Final Clinical Impressions(s) / ED Diagnoses   New Prescriptions New Prescriptions   No medications on file  Ice elevation and tylenol alternating with ibuprofen for any pain.  All questions answered to patient's satisfaction. Based on history and exam patient has been appropriately medically screened and emergency conditions excluded. Patient is stable for discharge at this time. Follow up with your PMD for recheck in 2 days and strict return precautions given   Sierria Bruney, MD 02/16/16 2257

## 2016-02-18 ENCOUNTER — Encounter (HOSPITAL_BASED_OUTPATIENT_CLINIC_OR_DEPARTMENT_OTHER): Payer: Self-pay | Admitting: *Deleted

## 2016-02-19 ENCOUNTER — Telehealth: Payer: Self-pay

## 2016-02-19 NOTE — Telephone Encounter (Signed)
Left message on patient's phone on 02/18/16, tried calling today and unable to leave message for patient. Letter sent to patient.  Kevin Sosa this is a referral I received from the ER. Can you please call him to book a routine appointment. Thanks    ----- Message -----  From: [name], Kevin Sosa  Sent: 02/15/2016  7:32 AM  To: Kevin FrederickSteven Paul Armbruster, MD

## 2017-01-17 ENCOUNTER — Encounter (HOSPITAL_BASED_OUTPATIENT_CLINIC_OR_DEPARTMENT_OTHER): Payer: Self-pay

## 2017-01-17 ENCOUNTER — Emergency Department (HOSPITAL_BASED_OUTPATIENT_CLINIC_OR_DEPARTMENT_OTHER)
Admission: EM | Admit: 2017-01-17 | Discharge: 2017-01-17 | Disposition: A | Discharge status: Home / Self Care | Payer: Medicaid Other | Attending: Emergency Medicine | Admitting: Emergency Medicine

## 2017-01-17 DIAGNOSIS — R51 Headache: Secondary | ICD-10-CM | POA: Insufficient documentation

## 2017-01-17 DIAGNOSIS — Z5321 Procedure and treatment not carried out due to patient leaving prior to being seen by health care provider: Secondary | ICD-10-CM | POA: Insufficient documentation

## 2017-01-17 HISTORY — DX: Essential (primary) hypertension: I10

## 2017-01-17 NOTE — ED Triage Notes (Signed)
Pt reports right sided localized headache since last night, now headache is generalized. Associated runny nose, watery eye, denies fever, or rash.

## 2017-05-02 ENCOUNTER — Emergency Department (HOSPITAL_BASED_OUTPATIENT_CLINIC_OR_DEPARTMENT_OTHER)
Admission: EM | Admit: 2017-05-02 | Discharge: 2017-05-02 | Disposition: A | Discharge status: Home / Self Care | Payer: Self-pay | Attending: Emergency Medicine | Admitting: Emergency Medicine

## 2017-05-02 ENCOUNTER — Encounter (HOSPITAL_BASED_OUTPATIENT_CLINIC_OR_DEPARTMENT_OTHER): Payer: Self-pay | Admitting: Emergency Medicine

## 2017-05-02 ENCOUNTER — Other Ambulatory Visit: Payer: Self-pay

## 2017-05-02 DIAGNOSIS — I10 Essential (primary) hypertension: Secondary | ICD-10-CM | POA: Insufficient documentation

## 2017-05-02 DIAGNOSIS — H16001 Unspecified corneal ulcer, right eye: Secondary | ICD-10-CM | POA: Insufficient documentation

## 2017-05-02 DIAGNOSIS — Z79899 Other long term (current) drug therapy: Secondary | ICD-10-CM | POA: Insufficient documentation

## 2017-05-02 MED ORDER — FLUORESCEIN SODIUM 0.6 MG OP STRP
ORAL_STRIP | OPHTHALMIC | Status: AC
  Filled 2017-05-02: qty 1

## 2017-05-02 MED ORDER — MOXIFLOXACIN HCL 0.5 % OP SOLN: 1.0000 [drp] | OPHTHALMIC | 0 refills | Status: DC

## 2017-05-02 MED ORDER — TOBRAMYCIN 0.3 % OP SOLN
1.0000 [drp] | OPHTHALMIC | Status: DC
  Administered 2017-05-02: 1 [drp] via OPHTHALMIC
  Filled 2017-05-02: qty 5

## 2017-05-02 MED ORDER — TETRACAINE HCL 0.5 % OP SOLN
2.0000 [drp] | Freq: Once | OPHTHALMIC | Status: AC
  Administered 2017-05-02: 2 [drp] via OPHTHALMIC
  Filled 2017-05-02: qty 4

## 2017-05-02 MED ORDER — FLUORESCEIN SODIUM 1 MG OP STRP: 1.0000 | ORAL_STRIP | Freq: Once | OPHTHALMIC | Status: DC

## 2017-05-02 NOTE — ED Notes (Signed)
Pt states he woke up with glaze over his right eye. Sclera reddened. Does not itch, but it is draining. Unable to do visual acuity d/t pt "wears contacts and glasses, but does not have glasses with him d/t cost and not being able to afford."

## 2017-05-02 NOTE — ED Triage Notes (Signed)
R eye pain and difficulty seeing. Pt states he sees a white blur. Pt reports he woke up this way.

## 2017-05-02 NOTE — Discharge Instructions (Signed)
Please read and follow all provided instructions.  Your diagnoses today include:  1. Ulcer of right cornea     Tests performed today include:  Visual acuity testing to check your vision  Fluorescein dye examination to look for scratches on your eye  Tonometry to check the pressure inside of your eye  Vital signs. See below for your results today.   Medications prescribed:   Tobrex (tobramycin) - antibiotic eye drops or eye ointment  Use this medication as follows: Use 1-2 drops in affected eye every 2 hours while awake.   Moxifloxacin - antibiotic eye drops  Take any prescribed medications only as directed.  Home care instructions:  Follow any educational materials contained in this packet. If you wear contact lenses, do not use them until your eye caregiver approves. Follow-up care is necessary to be sure the infection is healing if not completely resolved in 2-3 days. See your caregiver or eye specialist as suggested for followup.   If you have an eye infection, wash your hands often as this is very contagious and is easily spread from person to person.   Follow-up instructions: Please follow-up with Dr. Dione BoozeGroat at his office at 8am on Tuesday morning. Call him sooner if you get worse. [phone].  Return instructions:   Please return to the Emergency Department if you experience worsening symptoms.   Please return immediately if you develop severe pain, pus drainage, new change in vision, or fever.  Please return if you have any other emergent concerns.  Additional Information:  Your vital signs today were: BP (!) 147/85 (BP Location: Left Arm)    Pulse 70    Temp 98.7 F (37.1 C) (Oral)    Resp 18    Ht 5\' 7"  (1.702 m)    Wt 86.2 kg (190 lb)    SpO2 100%    BMI 29.76 kg/m  If your blood pressure (BP) was elevated above 135/85 this visit, please have this repeated by your doctor within one month. ---------------

## 2017-05-02 NOTE — ED Provider Notes (Signed)
[facility] EMERGENCY DEPARTMENT Provider Note   CSN: [id number] Arrival date & time: 05/02/17  1621     History   Chief Complaint Chief Complaint  Patient presents with  . Eye Problem    HPI Kevin Sosa is a 21 y.o. male.  Patient presents with complaints of right eye pain and redness, change in vision starting yesterday morning.  Patient states that he slept with his contacts in overnight.  He describes the vision change as a film in his vision or a fog.  He can see shapes and objects.  Pain is mild to moderate.  He states that the eye was swollen last night but better today.  No fevers.  No headache or head injury.  No treatments prior to arrival. The onset of this condition was acute. The course is worsening. Aggravating factors: none. Alleviating factors: none.        Past Medical History:  Diagnosis Date  . Hypertension     There are no active problems to display for this patient.   Past Surgical History:  Procedure Laterality Date  . WISDOM TOOTH EXTRACTION         Home Medications    Prior to Admission medications   Medication Sig Start Date End Date Taking? Authorizing Provider  cephALEXin (KEFLEX) 500 MG capsule Take 1 capsule (500 mg total) by mouth 4 (four) times daily. 06/28/15   Hedges, Tinnie GensJeffrey, PA-C  dicyclomine (BENTYL) 20 MG tablet Take 1 tablet (20 mg total) by mouth 2 (two) times daily. 02/15/16   Palumbo, April, MD  traMADol (ULTRAM) 50 MG tablet Take 1 tablet (50 mg total) by mouth every 6 (six) hours as needed. 06/28/15   Eyvonne MechanicHedges, Jeffrey, PA-C    Family History No family history on file.  Social History Social History   Tobacco Use  . Smoking status: Never Smoker  . Smokeless tobacco: Never Used  Substance Use Topics  . Alcohol use: No    Comment: occasionally  . Drug use: No     Allergies   Patient has no known allergies.   Review of Systems Review of Systems  Constitutional: Negative for fever.  HENT: Negative  for rhinorrhea and sore throat.   Eyes: Positive for pain and redness. Negative for photophobia, discharge, itching and visual disturbance.  Respiratory: Negative for cough.   Cardiovascular: Negative for chest pain.  Gastrointestinal: Negative for abdominal pain, diarrhea, nausea and vomiting.  Genitourinary: Negative for dysuria.  Musculoskeletal: Negative for myalgias.  Skin: Negative for rash.  Neurological: Negative for headaches.     Physical Exam Updated Vital Signs Ht 5\' 7"  (1.702 m)   Wt 86.2 kg (190 lb)   BMI 29.76 kg/m   Physical Exam  Constitutional: He appears well-developed and well-nourished.  HENT:  Head: Normocephalic and atraumatic.  Eyes: EOM and lids are normal. Pupils are equal, round, and reactive to light. Right eye exhibits no chemosis and no discharge. Left eye exhibits no chemosis and no discharge. Right conjunctiva is injected. Left conjunctiva is not injected.  Slit lamp exam:      The right eye shows corneal flare and corneal ulcer. The right eye shows no foreign body and no hyphema.       The left eye shows no corneal ulcer, no foreign body and no hyphema.  Neck: Normal range of motion. Neck supple.  Pulmonary/Chest: No respiratory distress.  Neurological: He is alert.  Skin: Skin is warm and dry.  Psychiatric: He has a normal mood  and affect.  Nursing note and vitals reviewed.    ED Treatments / Results  Labs (all labs ordered are listed, but only abnormal results are displayed) Labs Reviewed - No data to display  EKG  EKG Interpretation None       Radiology No results found.  Procedures Procedures (including critical care time)  Medications Ordered in ED Medications  fluorescein ophthalmic strip 1 strip (not administered)  tobramycin (TOBREX) 0.3 % ophthalmic solution 1 drop (1 drop Right Eye Given 05/02/17 1823)  tetracaine (PONTOCAINE) 0.5 % ophthalmic solution 2 drop (2 drops Left Eye Given 05/02/17 1714)     Initial  Impression / Assessment and Plan / ED Course  I have reviewed the triage vital signs and the nursing notes.  Pertinent labs & imaging results that were available during my care of the patient were reviewed by me and considered in my medical decision making (see chart for details).     Patient seen and examined. Work-up initiated. Medications ordered.   Vital signs reviewed and are as follows: BP (!) 147/85 (BP Location: Left Arm)   Pulse 70   Temp 98.7 F (37.1 C) (Oral)   Resp 18   Ht 5\' 7"  (1.702 m)   Wt 86.2 kg (190 lb)   SpO2 100%   BMI 29.76 kg/m   Exam performed. Two drops of tetracaine instilled into affected eye.   Fluorescein strip applied to affected eye. Slit lapm used to assess for corneal abrasion. No corneal abrasion identified. No significant uptake. No foreign bodies noted. No visible hyphema.   Tonometry performed. Right eye pressure: 18   Patient tolerated procedure well without immediate complication.     Discussed case with Dr. Dione BoozeGroat.  Advises double coverage with Tobrex drops and Vigamox drops.  We have Tobrex here for the patient.  Prescription given for Vigamox.  I have given patient contact information for Dr. Dione BoozeGroat who recommends that he call if worsening or not improved in 24 hours.  Otherwise follow-up with him in his office on Tuesday morning at 8 AM.  Patient advised and states that he will follow his instructions.   Final Clinical Impressions(s) / ED Diagnoses   Final diagnoses:  Ulcer of right cornea   Patient with likely right corneal ulcer causing eye pain.  Lower concern for iritis.  Follow-up instructions and treatments recommendations obtained from ophthalmology.  Patient advised as above.  ED Discharge Orders        Ordered    moxifloxacin (VIGAMOX) 0.5 % ophthalmic solution  Every 2 hours while awake     05/02/17 1859       Renne CriglerGeiple, Pervis Macintyre, PA-C 05/02/17 2048    Tilden Fossaees, Elizabeth, MD 05/03/17 [phone]

## 2019-11-12 ENCOUNTER — Other Ambulatory Visit: Payer: Self-pay

## 2019-11-12 ENCOUNTER — Emergency Department (HOSPITAL_COMMUNITY)
Admission: EM | Admit: 2019-11-12 | Discharge: 2019-11-12 | Disposition: A | Discharge status: Home / Self Care | Payer: Self-pay | Attending: Emergency Medicine | Admitting: Emergency Medicine

## 2019-11-12 ENCOUNTER — Encounter (HOSPITAL_COMMUNITY): Payer: Self-pay

## 2019-11-12 DIAGNOSIS — R519 Headache, unspecified: Secondary | ICD-10-CM | POA: Insufficient documentation

## 2019-11-12 DIAGNOSIS — R42 Dizziness and giddiness: Secondary | ICD-10-CM | POA: Insufficient documentation

## 2019-11-12 DIAGNOSIS — F17210 Nicotine dependence, cigarettes, uncomplicated: Secondary | ICD-10-CM | POA: Insufficient documentation

## 2019-11-12 DIAGNOSIS — R531 Weakness: Secondary | ICD-10-CM | POA: Insufficient documentation

## 2019-11-12 DIAGNOSIS — D72829 Elevated white blood cell count, unspecified: Secondary | ICD-10-CM | POA: Insufficient documentation

## 2019-11-12 DIAGNOSIS — Y939 Activity, unspecified: Secondary | ICD-10-CM | POA: Insufficient documentation

## 2019-11-12 DIAGNOSIS — G8191 Hemiplegia, unspecified affecting right dominant side: Secondary | ICD-10-CM | POA: Insufficient documentation

## 2019-11-12 DIAGNOSIS — W19XXXA Unspecified fall, initial encounter: Secondary | ICD-10-CM | POA: Insufficient documentation

## 2019-11-12 DIAGNOSIS — Z5321 Procedure and treatment not carried out due to patient leaving prior to being seen by health care provider: Secondary | ICD-10-CM | POA: Insufficient documentation

## 2019-11-12 DIAGNOSIS — Z20822 Contact with and (suspected) exposure to covid-19: Secondary | ICD-10-CM | POA: Insufficient documentation

## 2019-11-12 DIAGNOSIS — R29810 Facial weakness: Secondary | ICD-10-CM | POA: Insufficient documentation

## 2019-11-12 DIAGNOSIS — Y9289 Other specified places as the place of occurrence of the external cause: Secondary | ICD-10-CM | POA: Insufficient documentation

## 2019-11-12 DIAGNOSIS — R079 Chest pain, unspecified: Secondary | ICD-10-CM | POA: Insufficient documentation

## 2019-11-12 DIAGNOSIS — R55 Syncope and collapse: Secondary | ICD-10-CM | POA: Insufficient documentation

## 2019-11-12 DIAGNOSIS — R064 Hyperventilation: Secondary | ICD-10-CM | POA: Insufficient documentation

## 2019-11-12 DIAGNOSIS — R200 Anesthesia of skin: Principal | ICD-10-CM | POA: Insufficient documentation

## 2019-11-12 DIAGNOSIS — Y999 Unspecified external cause status: Secondary | ICD-10-CM | POA: Insufficient documentation

## 2019-11-12 NOTE — ED Provider Notes (Cosign Needed)
I attempted to evaluate the patient, however upon reaching the patient's room, he was exiting the room, had pulled out his IV, and stated he "needed to get out of here."  I attempted to redirect the patient to at least sit and talk with me, however he continued to walk towards the exit with steady gait.  He did appear anxious.  Encouraged he return should he change his mind to be evaluated.  Patient left prior to evaluation.   Nicholous Girgenti, [place] N, PA-C 11/12/19 2051

## 2019-11-12 NOTE — ED Triage Notes (Signed)
Pt BIB [facility] from home, following syncopal episode, fall. Pt came in from outside, felt "woozy" and passed out. Upon waking, felt he couldn't lift himself up, called EMS. C/o right central chest pain, r arm limited movement. 324 ASA PTA. VS stable, A&O x4 at this time, NAD noted.

## 2019-11-12 NOTE — ED Notes (Signed)
Pt spoke to someone on the phone, became very agitated afterwards, hyperventilating. Pt left his room and began walking towards exit. Removed own IV. Provider made aware.

## 2019-11-13 ENCOUNTER — Emergency Department (HOSPITAL_COMMUNITY): Payer: Medicaid Other

## 2019-11-13 ENCOUNTER — Observation Stay (HOSPITAL_COMMUNITY)
Admission: EM | Admit: 2019-11-13 | Discharge: 2019-11-13 | Discharge status: Against Medical Advice | Payer: Self-pay | Attending: Internal Medicine | Admitting: Internal Medicine

## 2019-11-13 ENCOUNTER — Encounter (HOSPITAL_COMMUNITY): Payer: Self-pay | Admitting: Emergency Medicine

## 2019-11-13 ENCOUNTER — Observation Stay (HOSPITAL_COMMUNITY): Payer: Medicaid Other

## 2019-11-13 ENCOUNTER — Observation Stay (HOSPITAL_BASED_OUTPATIENT_CLINIC_OR_DEPARTMENT_OTHER): Payer: Medicaid Other

## 2019-11-13 DIAGNOSIS — R29898 Other symptoms and signs involving the musculoskeletal system: Secondary | ICD-10-CM

## 2019-11-13 DIAGNOSIS — S0993XA Unspecified injury of face, initial encounter: Secondary | ICD-10-CM

## 2019-11-13 DIAGNOSIS — M79601 Pain in right arm: Secondary | ICD-10-CM

## 2019-11-13 DIAGNOSIS — R55 Syncope and collapse: Secondary | ICD-10-CM

## 2019-11-13 DIAGNOSIS — D72829 Elevated white blood cell count, unspecified: Secondary | ICD-10-CM

## 2019-11-13 DIAGNOSIS — G839 Paralytic syndrome, unspecified: Secondary | ICD-10-CM

## 2019-11-13 DIAGNOSIS — R519 Headache, unspecified: Secondary | ICD-10-CM

## 2019-11-13 DIAGNOSIS — R2990 Unspecified symptoms and signs involving the nervous system: Secondary | ICD-10-CM | POA: Diagnosis present

## 2019-11-13 LAB — DIFFERENTIAL
Abs Immature Granulocytes: 0.03 10*3/uL (ref 0.00–0.07)
Basophils Absolute: 0 10*3/uL (ref 0.0–0.1)
Basophils Relative: 0 %
Eosinophils Absolute: 0 10*3/uL (ref 0.0–0.5)
Eosinophils Relative: 0 %
Immature Granulocytes: 0 %
Lymphocytes Relative: 12 %
Lymphs Abs: 1.6 10*3/uL (ref 0.7–4.0)
Monocytes Absolute: 0.8 10*3/uL (ref 0.1–1.0)
Monocytes Relative: 6 %
Neutro Abs: 11 10*3/uL — ABNORMAL HIGH (ref 1.7–7.7)
Neutrophils Relative %: 82 %

## 2019-11-13 LAB — COMPREHENSIVE METABOLIC PANEL
ALT: 23 U/L (ref 0–44)
AST: 27 U/L (ref 15–41)
Albumin: 5 g/dL (ref 3.5–5.0)
Alkaline Phosphatase: 67 U/L (ref 38–126)
Anion gap: 11 (ref 5–15)
BUN: 13 mg/dL (ref 6–20)
CO2: 23 mmol/L (ref 22–32)
Calcium: 10 mg/dL (ref 8.9–10.3)
Chloride: 109 mmol/L (ref 98–111)
Creatinine, Ser: 1.38 mg/dL — ABNORMAL HIGH (ref 0.61–1.24)
GFR calc Af Amer: >60 mL/min (ref >60)
GFR calc non Af Amer: >60 mL/min (ref >60)
Glucose, Bld: 101 mg/dL — ABNORMAL HIGH (ref 70–99)
Potassium: 4.8 mmol/L (ref 3.5–5.1)
Sodium: 143 mmol/L (ref 135–145)
Total Bilirubin: 0.5 mg/dL (ref 0.3–1.2)
Total Protein: 7.8 g/dL (ref 6.5–8.1)

## 2019-11-13 LAB — CBC
HCT: 52.2 % — ABNORMAL HIGH (ref 39.0–52.0)
HCT: 48.3 % (ref 39.0–52.0)
Hemoglobin: 16 g/dL (ref 13.0–17.0)
Hemoglobin: 15 g/dL (ref 13.0–17.0)
MCH: 25.2 pg — ABNORMAL LOW (ref 26.0–34.0)
MCH: 25.6 pg — ABNORMAL LOW (ref 26.0–34.0)
MCHC: 30.7 g/dL (ref 30.0–36.0)
MCHC: 31.1 g/dL (ref 30.0–36.0)
MCV: 82.2 fL (ref 80.0–100.0)
MCV: 82.6 fL (ref 80.0–100.0)
Platelets: 291 10*3/uL (ref 150–400)
Platelets: 230 10*3/uL (ref 150–400)
RBC: 6.35 MIL/uL — ABNORMAL HIGH (ref 4.22–5.81)
RBC: 5.85 MIL/uL — ABNORMAL HIGH (ref 4.22–5.81)
RDW: 13.8 % (ref 11.5–15.5)
RDW: 13.7 % (ref 11.5–15.5)
WBC: 13.5 10*3/uL — ABNORMAL HIGH (ref 4.0–10.5)
WBC: 8.2 10*3/uL (ref 4.0–10.5)
nRBC: 0 % (ref 0.0–0.2)
nRBC: 0 % (ref 0.0–0.2)

## 2019-11-13 LAB — I-STAT CHEM 8, ED
BUN: 16 mg/dL (ref 6–20)
Calcium, Ion: 1.19 mmol/L (ref 1.15–1.40)
Chloride: 109 mmol/L (ref 98–111)
Creatinine, Ser: 1.4 mg/dL — ABNORMAL HIGH (ref 0.61–1.24)
Glucose, Bld: 99 mg/dL (ref 70–99)
HCT: 50 % (ref 39.0–52.0)
Hemoglobin: 17 g/dL (ref 13.0–17.0)
Potassium: 4.2 mmol/L (ref 3.5–5.1)
Sodium: 144 mmol/L (ref 135–145)
TCO2: 25 mmol/L (ref 22–32)

## 2019-11-13 LAB — PROTIME-INR
INR: 1 (ref 0.8–1.2)
Prothrombin Time: 13 seconds (ref 11.4–15.2)

## 2019-11-13 LAB — APTT: aPTT: 24 seconds (ref 24–36)

## 2019-11-13 LAB — ECHOCARDIOGRAM COMPLETE
Height: 66 in
Weight: 3199.99 oz

## 2019-11-13 LAB — CK: Total CK: 580 U/L — ABNORMAL HIGH (ref 49–397)

## 2019-11-13 LAB — ETHANOL: Alcohol, Ethyl (B): <10 mg/dL (ref <10)

## 2019-11-13 LAB — HIV ANTIBODY (ROUTINE TESTING W REFLEX): HIV Screen 4th Generation wRfx: NONREACTIVE

## 2019-11-13 MED ORDER — ACETAMINOPHEN 650 MG RE SUPP: 650.0000 mg | RECTAL | Status: DC | PRN

## 2019-11-13 MED ORDER — FENTANYL CITRATE (PF) 100 MCG/2ML IJ SOLN
100.0000 ug | Freq: Once | INTRAMUSCULAR | Status: AC
  Administered 2019-11-13: 100 ug via INTRAVENOUS
  Filled 2019-11-13: qty 2

## 2019-11-13 MED ORDER — IOHEXOL 350 MG/ML SOLN
115.0000 mL | Freq: Once | INTRAVENOUS | Status: AC | PRN
  Administered 2019-11-13: 115 mL via INTRAVENOUS

## 2019-11-13 MED ORDER — LORAZEPAM 2 MG/ML IJ SOLN
1.0000 mg | Freq: Once | INTRAMUSCULAR | Status: AC
  Administered 2019-11-13: 1 mg via INTRAVENOUS
  Filled 2019-11-13: qty 1

## 2019-11-13 MED ORDER — ACETAMINOPHEN 160 MG/5ML PO SOLN: 650.0000 mg | ORAL | Status: DC | PRN

## 2019-11-13 MED ORDER — STROKE: EARLY STAGES OF RECOVERY BOOK: Freq: Once | Status: DC

## 2019-11-13 MED ORDER — ENOXAPARIN SODIUM 40 MG/0.4ML ~~LOC~~ SOLN: 40.0000 mg | Freq: Every day | SUBCUTANEOUS | Status: DC

## 2019-11-13 MED ORDER — ACETAMINOPHEN 325 MG PO TABS: 650.0000 mg | ORAL_TABLET | ORAL | Status: DC | PRN

## 2019-11-13 NOTE — ED Provider Notes (Signed)
[facility] EMERGENCY DEPARTMENT Provider Note   CSN: [id number] Arrival date & time: 11/12/19  2359  An emergency department physician performed an initial assessment on this suspected stroke patient at 0030.  History Chief Complaint  Patient presents with  . facial numbness   Level 5 caveat due to acuity of condition Kevin Sosa is a 24 y.o. male.  The history is provided by the patient and the spouse. The history is limited by the condition of the patient.  Neurologic Problem This is a new problem. The current episode started less than 1 hour ago. The problem occurs constantly. The problem has not changed since onset.Associated symptoms include headaches. The symptoms are aggravated by walking. Nothing relieves the symptoms.   Patient presented with right facial droop and right arm drift.  Code stroke was enacted immediately    Past Medical History:  Diagnosis Date  . Hypertension     There are no problems to display for this patient.   Past Surgical History:  Procedure Laterality Date  . WISDOM TOOTH EXTRACTION         History reviewed. No pertinent family history.  Social History   Tobacco Use  . Smoking status: Current Every Day Smoker    Types: Cigarettes  . Smokeless tobacco: Never Used  Substance Use Topics  . Alcohol use: No    Comment: occasionally  . Drug use: No    Home Medications Prior to Admission medications   Medication Sig Start Date End Date Taking? Authorizing Provider  moxifloxacin (VIGAMOX) 0.5 % ophthalmic solution Place 1 drop into the right eye every 2 (two) hours while awake. 05/02/17   Renne Crigler, PA-C    Allergies    Patient has no known allergies.  Review of Systems   Review of Systems  Unable to perform ROS: Acuity of condition  Neurological: Positive for headaches.    Physical Exam Updated Vital Signs BP 137/72 (BP Location: Right Arm)   Pulse (!) 123   Temp 99 F (37.2 C) (Oral)   Resp  18   SpO2 98%   Physical Exam CONSTITUTIONAL: Well developed/well nourished, anxious HEAD: Normocephalic/atraumatic, no visible trauma EYES: EOMI/PERRL ENMT: Mucous membranes moist, tenderness noted throughout right mandible.  No deformities.  Patient reports it hurts to open his mouth.  No obvious dental injury.  No facial instability.  No nasal tenderness NECK: supple no meningeal signs SPINE/BACK:entire spine nontender CV: S1/S2 noted, no murmurs/rubs/gallops noted LUNGS: Lungs are clear to auscultation bilaterally, no apparent distress ABDOMEN: soft, nontender NEURO: Pt is awake/alert.  No obvious facial droop as patient is keeping mouth clenched.  Patient is unable to move his arm.  He reports diffuse numbness and pain in the right arm.  Patient has a mild drift of the right leg.  He is able to ambulate. EXTREMITIES: pulses normal/equal, full ROM, diffuse tenderness noted to the right upper and lower portions of his arm.  No obvious deformities.  Distal pulses intact SKIN: warm, color normal PSYCH: Anxious  ED Results / Procedures / Treatments   Labs (all labs ordered are listed, but only abnormal results are displayed) Labs Reviewed  CBC - Abnormal; Notable for the following components:      Result Value   WBC 13.5 (*)    RBC 6.35 (*)    HCT 52.2 (*)    MCH 25.2 (*)    All other components within normal limits  DIFFERENTIAL - Abnormal; Notable for the following components:   Neutro  Abs 11.0 (*)    All other components within normal limits  COMPREHENSIVE METABOLIC PANEL - Abnormal; Notable for the following components:   Glucose, Bld 101 (*)    Creatinine, Ser 1.38 (*)    All other components within normal limits  I-STAT CHEM 8, ED - Abnormal; Notable for the following components:   Creatinine, Ser 1.40 (*)    All other components within normal limits  SARS CORONAVIRUS 2 (TAT 6-24 HRS)  ETHANOL  PROTIME-INR  APTT  RAPID URINE DRUG SCREEN, HOSP PERFORMED  URINALYSIS,  ROUTINE W REFLEX MICROSCOPIC    EKG None  Radiology CT HEAD CODE STROKE WO CONTRAST  Result Date: 11/13/2019 CLINICAL DATA:  Code stroke. Initial evaluation for acute headache. Right facial droop. EXAM: CT HEAD WITHOUT CONTRAST TECHNIQUE: Contiguous axial images were obtained from the base of the skull through the vertex without intravenous contrast. COMPARISON:  None. FINDINGS: Brain: Cerebral volume within normal limits for patient age. No evidence for acute intracranial hemorrhage. No findings to suggest acute large vessel territory infarct. No mass lesion, midline shift, or mass effect. Ventricles are normal in size without evidence for hydrocephalus. No extra-axial fluid collection identified. Vascular: No hyperdense vessel identified. Skull: Scalp soft tissues demonstrate no acute abnormality. Calvarium intact. Sinuses/Orbits: Globes and orbital soft tissues within normal limits. Visualized paranasal sinuses are clear. No mastoid effusion. ASPECTS [facility] Stroke Program Early CT Score) - Ganglionic level infarction (caudate, lentiform nuclei, internal capsule, insula, M1-M3 cortex): 7 - Supraganglionic infarction (M4-M6 cortex): 3 Total score (0-10 with 10 being normal): 10 IMPRESSION: 1. Normal head CT.  No acute intracranial abnormality identified. 2. ASPECTS is 10. These results were communicated to Dr. Cheral Marker at 12:50 amon 6/20/2021by text page via the [facility] messaging system. Electronically Signed   By: Jeannine Boga M.D.   On: 11/13/2019 00:51    Procedures Procedures   Medications Ordered in ED Medications  fentaNYL (SUBLIMAZE) injection 100 mcg (100 mcg Intravenous Given 11/13/19 0135)    ED Course  I have reviewed the triage vital signs and the nursing notes.  Pertinent labs & imaging results that were available during my care of the patient were reviewed by me and considered in my medical decision making (see chart for details).    MDM Rules/Calculators/A&P                           1:41 AM Nursing staff told me that when patient checked and he had right-sided facial droop and arm drift.  I immediately called a code stroke.  Patient had a negative CT head.  He first reported that he wanted to leave, but his spouse talked to him and is staying.  She reports that earlier in the day they had been swimming and patient reported headache.  There is no trauma reported.  No drowning issues.  After swimming, patient reports feeling lightheaded and he fell.  That is when he came to the ER but left against advice.  After going home he started having facial pain and weakness as well as right arm weakness.  He has never had this before. Unclear etiology of the symptoms.  Discussed the case with Dr. Cheral Marker with neurology.  He request CT angio of the head neck, as well as CT perfusion.  I will also add on CT maxillofacial as patient may have injured his face in the fall.  We will also add on x-rays of his right arm. 4:35 AM  All CT imaging and x-rays are negative.  Patient still having weakness of the right arm.  He reports he cannot move it at all.  No findings of cervical spine injury on CT imaging.  Discussed the case with Dr. Otelia Limes who recommends medical admission for further work-up of paralysis of unclear etiology Patient is agreeable with plan at this time.  Discussed with Dr. Loney Loh for admission   This patient presents to the ED for concern of weakness, this involves an extensive number of treatment options, and is a complaint that carries with it a high risk of complications and morbidity.  The differential diagnosis includes stroke, intracranial hemorrhage, traumatic brain injury, electrolyte abnormality   Lab Tests:   I Ordered, reviewed, and interpreted labs, which included electrolytes, complete blood count, alcohol level, drug screen  Medicines ordered:   I ordered medication fentanyl for pain  Imaging Studies ordered:   I ordered imaging studies  which included CT head   I independently visualized and interpreted imaging which showed no acute findings  Additional history obtained:   Additional history obtained from spouse  Previous records obtained and reviewed   Consultations Obtained:   I consulted neurology Dr. Otelia Limes and discussed lab and imaging findings  Reevaluation:  After the interventions stated above, I reevaluated the patient and found pt stable/unchanged   Final Clinical Impression(s) / ED Diagnoses Final diagnoses:  Paresis (HCC)  Right leg weakness  Facial injury, initial encounter    Rx / DC Orders ED Discharge Orders    None       Zadie Rhine, MD 11/13/19 [phone]

## 2019-11-13 NOTE — Code Documentation (Addendum)
Responded to Code Stroke called at 0036 on pt already in ED.  Pt came to ED with c/o R facial droop and R arm drift, LSN-2030.  Pt had come to the ED earlier with c/o syncopy and facial numbness/tingling and left AMA.   Pt assessed in CT, NIH-5. CT head negative for acute changes.  Once CT head done, pt began to get anxious and say he wanted to leave AMA. Pt escorted back to ED room and ED MD notified.

## 2019-11-13 NOTE — ED Notes (Signed)
RN called Dr. Bebe Shaggy, told to call CODE STROKE. Pt brought to bridge

## 2019-11-13 NOTE — ED Notes (Signed)
MRI called pt is claustrophobic. Paged MD

## 2019-11-13 NOTE — Consult Note (Signed)
NEURO HOSPITALIST CONSULT NOTE   Requestig physician: Dr. Bebe Shaggy  Reason for Consult: Acute onset of right arm weakness  History obtained from:   Patient and Chart    HPI:                                                                                                                                          Kevin Sosa is an 24 y.o. male who re-presents to the ED with right arm weakness shortly after having left AMA during a work up for a syncopal episode followed by right central chest pain and "limited movement" of his right arm. When he re-presented, his c/c was severe facial numbness and tingling, right facial droop and right arm weakness, which he stated had begun soon after he left the ED AMA. Code Stroke was called and he was emergently transported to CT.   Past Medical History:  Diagnosis Date  . Hypertension     Past Surgical History:  Procedure Laterality Date  . WISDOM TOOTH EXTRACTION      History reviewed. No pertinent family history.            Social History:  reports that he has been smoking cigarettes. He has never used smokeless tobacco. He reports that he does not drink alcohol and does not use drugs.  No Known Allergies  HOME MEDICATIONS:                                                                                                                     Moxifloxacin ophthalmic solution   ROS:  As per HPI. He does not endorse any other symptoms.    Blood pressure 137/72, pulse (!) 123, temperature 99 F (37.2 C), temperature source Oral, resp. rate 18, SpO2 98 %.   General Examination:                                                                                                       Physical Exam  HEENT-  [place]/AT  Lungs- Respirations unlabored Extremities- No edema  Neurological  Examination Mental Status: Alert and fully oriented. Odd affect. Speech fluent with intact comprehension and naming.  Able to follow all commands without difficulty. Cranial Nerves: II: Visual fields intact bilaterally. No extinction to DSS. PERRL.  III,IV, VI: No ptosis. EOMI. No nystagmus.  V: Hypoesthesia to temperature in conjunction with decreased FT sensation to right side of face.  VII: During speech, both sides of face move equally. When asked to grimace, the patient contracts right side of mouth such that labial fissure on that side becomes shortened, while contracting left side of face maximally - appearance is most consistent with embellishment/non-physiological asymmetry. smile symmetric, facial light touch sensation normal bilaterally VIII: hearing intact to voice IX,X: No hypophonia or hoarseness XI: No movement of right shoulder when asked to shrug shoulders.  XII: midline tongue extension Motor: RUE: Will not move to command, except for approximately 2 mm of movement of thumb, forefinger and middle finger when asked to wiggle digits. There was some palpable resistance to gravity when arm was passively elevated and released.  RLE 4+/5 with waxing/waning strength and giveway LUE and LLE 5/5 Sensory: Decreased temp sensation to RUE and RLE. Dysesthesia to FT RUE and RLE. No extinction to DSS.  Deep Tendon Reflexes: 1+ and symmetric throughout Cerebellar: No ataxia with FNF on the left. Will not carry out FNF on the right. Bilateral H-S is normal.  Gait: Deferred   Lab Results: Basic Metabolic Panel: Recent Labs  Lab 11/13/19 0044  NA 144  K 4.2  CL 109  GLUCOSE 99  BUN 16  CREATININE 1.40*    CBC: Recent Labs  Lab 11/13/19 0044  HGB 17.0  HCT 50.0    Cardiac Enzymes: No results for input(s): CKTOTAL, CKMB, CKMBINDEX, TROPONINI in the last 168 hours.  Lipid Panel: No results for input(s): CHOL, TRIG, HDL, CHOLHDL, VLDL, LDLCALC in the last 168  hours.  Imaging: CT HEAD CODE STROKE WO CONTRAST  Result Date: 11/13/2019 CLINICAL DATA:  Code stroke. Initial evaluation for acute headache. Right facial droop. EXAM: CT HEAD WITHOUT CONTRAST TECHNIQUE: Contiguous axial images were obtained from the base of the skull through the vertex without intravenous contrast. COMPARISON:  None. FINDINGS: Brain: Cerebral volume within normal limits for patient age. No evidence for acute intracranial hemorrhage. No findings to suggest acute large vessel territory infarct. No mass lesion, midline shift, or mass effect. Ventricles are normal in size without evidence for hydrocephalus. No extra-axial fluid collection identified. Vascular: No hyperdense vessel identified. Skull: Scalp soft tissues demonstrate no acute abnormality. Calvarium intact. Sinuses/Orbits: Globes and orbital soft tissues within normal limits.  Visualized paranasal sinuses are clear. No mastoid effusion. ASPECTS [facility] Stroke Program Early CT Score) - Ganglionic level infarction (caudate, lentiform nuclei, internal capsule, insula, M1-M3 cortex): 7 - Supraganglionic infarction (M4-M6 cortex): 3 Total score (0-10 with 10 being normal): 10 IMPRESSION: 1. Normal head CT.  No acute intracranial abnormality identified. 2. ASPECTS is 10. These results were communicated to Dr. Cheral Marker at 12:50 amon 6/20/2021by text page via the [facility] messaging system. Electronically Signed   By: Jeannine Boga M.D.   On: 11/13/2019 00:51    Assessment: 24 year old male presenting with acute onset of RUE weakness and right facial droop 1. Exam with some features suggestive of a functional/inorganic etiology.  2. CT head normal. 3. Normal CTA of the head and neck. No large vessel occlusion, hemodynamically significant stenosis, or other acute vascular abnormality. 4. CTP: Negative CT perfusion. No evidence for acute core infarct or other perfusion deficit.  Recommendations: 1. MRI brain 2. Psychology consult  to assess for possible psychological or social stressors 3. Frequent neuro checks 4. PT/OT   Electronically signed: Dr. Kerney Elbe 11/13/2019, 1:00 AM

## 2019-11-13 NOTE — H&P (Signed)
History and Physical    Kevin Sosa XNA:[id number] DOB: [birth year] DOA: 11/13/2019  PCP: Patient, No Pcp Per Patient coming from: Home  Chief Complaint: Strokelike symptoms  HPI: Kevin Sosa is a 24 y.o. male with medical history significant of hypertension not on meds presenting to the ED for evaluation of right facial droop and right arm drift.  He presented to the ED earlier today with complaints of a syncopal episode/ fall and left AMA before he could be evaluated.  Patient states around 7:30 PM he passed out.  States he was walking at home saw black spots in his vision and "my head was full of air."  Denies preceding lightheadedness/dizziness, chest pain, shortness of breath, or palpitations.  States he came to the ED but left.  After leaving the ED he could not move his right arm and had tingling in his fingers which made him return.  He is also having pain in his right arm and forearm.  He thinks he must of hit his face when he passed out as he is not able to move the right side of his mouth due to pain.  ED Course: Afebrile.  WBC count 13.5.  Blood ethanol level undetectable. UDS pending.  Head CT negative for acute intracranial abnormality. CT cerebral perfusion study negative for acute core infarct or other perfusion deficit. CTA head and neck negative for LVO. X-rays of the right humerus and forearm negative for acute abnormality. CT maxillofacial negative for acute abnormality.  Neurology consulted.   Review of Systems:  All systems reviewed and apart from history of presenting illness, are negative.   Past Medical History:  Diagnosis Date  . Hypertension     Past Surgical History:  Procedure Laterality Date  . WISDOM TOOTH EXTRACTION       reports that he has been smoking cigarettes. He has never used smokeless tobacco. He reports that he does not drink alcohol and does not use drugs.  No Known Allergies  History reviewed. No pertinent family  history.  Prior to Admission medications   Not on File    Physical Exam: Vitals:   11/13/19 0012 11/13/19 0318 11/13/19 0400 11/13/19 0517  BP: 137/72 131/76 (!) 145/91 139/86  Pulse: (!) 123 83 87 87  Resp: 18 16 (!) 28 (!) 24  Temp: 99 F (37.2 C)     TempSrc: Oral     SpO2: 98% 100% 99% 98%    Physical Exam Constitutional:      General: He is not in acute distress. HENT:     Head: Normocephalic.  Eyes:     Extraocular Movements: Extraocular movements intact.  Cardiovascular:     Rate and Rhythm: Normal rate and regular rhythm.     Pulses: Normal pulses.  Pulmonary:     Effort: Pulmonary effort is normal. No respiratory distress.     Breath sounds: Normal breath sounds. No wheezing or rales.  Abdominal:     General: Bowel sounds are normal. There is no distension.     Palpations: Abdomen is soft.     Tenderness: There is no abdominal tenderness. There is no guarding.  Musculoskeletal:        General: Tenderness present. No swelling.     Cervical back: Normal range of motion and neck supple.     Comments: Right arm and forearm tender to palpation  Skin:    General: Skin is warm and dry.  Neurological:     Mental Status: He is alert and oriented  to person, place, and time.     Comments: Speech fluent, no facial droop noticed when patient speaks and he is able to puff his cheeks.  However, when asked to smile he is not able to move the right side of his mouth. Right upper extremity grip strength 0/5.  No active range of motion.  When his arm is raised, he is able to keep it up against gravity for an extended period of time. Strength 5 out of 5 in the left upper extremity and bilateral lower extremities. Sensation to light touch slightly diminished in the right upper extremity but intact elsewhere.      Labs on Admission: I have personally reviewed following labs and imaging studies  CBC: Recent Labs  Lab 11/13/19 0031 11/13/19 0044  WBC 13.5*  --   NEUTROABS  11.0*  --   HGB 16.0 17.0  HCT 52.2* 50.0  MCV 82.2  --   PLT 291  --    Basic Metabolic Panel: Recent Labs  Lab 11/13/19 0031 11/13/19 0044  NA 143 144  K 4.8 4.2  CL 109 109  CO2 23  --   GLUCOSE 101* 99  BUN 13 16  CREATININE 1.38* 1.40*  CALCIUM 10.0  --    GFR: Estimated Creatinine Clearance: 85.1 mL/min (A) (by C-G formula based on SCr of 1.4 mg/dL (H)). Liver Function Tests: Recent Labs  Lab 11/13/19 0031  AST 27  ALT 23  ALKPHOS 67  BILITOT 0.5  PROT 7.8  ALBUMIN 5.0   No results for input(s): LIPASE, AMYLASE in the last 168 hours. No results for input(s): AMMONIA in the last 168 hours. Coagulation Profile: Recent Labs  Lab 11/13/19 0031  INR 1.0   Cardiac Enzymes: No results for input(s): CKTOTAL, CKMB, CKMBINDEX, TROPONINI in the last 168 hours. BNP (last 3 results) No results for input(s): PROBNP in the last 8760 hours. HbA1C: No results for input(s): HGBA1C in the last 72 hours. CBG: No results for input(s): GLUCAP in the last 168 hours. Lipid Profile: No results for input(s): CHOL, HDL, LDLCALC, TRIG, CHOLHDL, LDLDIRECT in the last 72 hours. Thyroid Function Tests: No results for input(s): TSH, T4TOTAL, FREET4, T3FREE, THYROIDAB in the last 72 hours. Anemia Panel: No results for input(s): VITAMINB12, FOLATE, FERRITIN, TIBC, IRON, RETICCTPCT in the last 72 hours. Urine analysis:    Component Value Date/Time   COLORURINE YELLOW 02/15/2016 0337   APPEARANCEUR CLEAR 02/15/2016 0337   LABSPEC 1.017 02/15/2016 0337   PHURINE 7.0 02/15/2016 0337   GLUCOSEU NEGATIVE 02/15/2016 0337   HGBUR NEGATIVE 02/15/2016 0337   BILIRUBINUR NEGATIVE 02/15/2016 0337   KETONESUR NEGATIVE 02/15/2016 0337   PROTEINUR NEGATIVE 02/15/2016 0337   NITRITE NEGATIVE 02/15/2016 0337   LEUKOCYTESUR NEGATIVE 02/15/2016 9622    Radiological Exams on Admission: CT Angio Head W or Wo Contrast  Result Date: 11/13/2019 CLINICAL DATA:  Initial evaluation for neuro  deficit, acute stroke. EXAM: CT ANGIOGRAPHY HEAD AND NECK CT PERFUSION BRAIN TECHNIQUE: Multidetector CT imaging of the head and neck was performed using the standard protocol during bolus administration of intravenous contrast. Multiplanar CT image reconstructions and MIPs were obtained to evaluate the vascular anatomy. Carotid stenosis measurements (when applicable) are obtained utilizing NASCET criteria, using the distal internal carotid diameter as the denominator. Multiphase CT imaging of the brain was performed following IV bolus contrast injection. Subsequent parametric perfusion maps were calculated using RAPID software. CONTRAST:  OMNIPAQUE IOHEXOL 350 MG/ML SOLN COMPARISON:  Prior head CT from  earlier the same day. FINDINGS: CTA NECK FINDINGS Aortic arch: Visualized aortic arch of normal caliber with normal branch pattern. No flow-limiting stenosis about the origin of the great vessels. Right carotid system: Right common and internal carotid arteries widely patent without stenosis, dissection or occlusion. Left carotid system: Left common and internal carotid arteries widely patent without stenosis, dissection or occlusion. Vertebral arteries: Both vertebral arteries arise from the subclavian arteries. No proximal subclavian artery stenosis. Vertebral arteries widely patent without stenosis, dissection or occlusion. Skeleton: No acute osseous abnormality. No discrete or worrisome osseous lesions. Other neck: No other acute soft tissue abnormality within the neck. Upper chest: Visualized upper chest demonstrates no acute finding. Review of the MIP images confirms the above findings CTA HEAD FINDINGS Anterior circulation: Both internal carotid arteries widely patent to the termini without stenosis or other abnormality. A1 segments widely patent. Normal anterior communicating artery complex. Anterior cerebral arteries widely patent to their distal aspects without stenosis. No M1 stenosis or occlusion.  Normal MCA bifurcations. Distal MCA branches well perfused and symmetric. Posterior circulation: Both vertebral arteries widely patent to the vertebrobasilar junction without stenosis. Both picas patent. Basilar patent to its distal aspect without stenosis. Superior cerebral arteries patent bilaterally. Both PCAs primarily supplied via the basilar well perfused to their distal aspects. Venous sinuses: Patent allowing for timing the contrast bolus. Anatomic variants: None significant.  No intracranial aneurysm. Review of the MIP images confirms the above findings CT Brain Perfusion Findings: ASPECTS: 10 CBF (<30%) Volume: 65mL Perfusion (Tmax>6.0s) volume: 80mL Mismatch Volume: 28mL Infarction Location:Negative CT perfusion with no evidence for acute core infarct or other perfusion deficit. IMPRESSION: CTA HEAD AND NECK IMPRESSION: Normal CTA of the head and neck. No large vessel occlusion, hemodynamically significant stenosis, or other acute vascular abnormality. CT PERFUSION IMPRESSION: Negative CT perfusion. No evidence for acute core infarct or other perfusion deficit. Electronically Signed   By: Rise Mu M.D.   On: 11/13/2019 03:00   DG Forearm Right  Result Date: 11/13/2019 CLINICAL DATA:  Pain, syncopal episode. EXAM: RIGHT FOREARM - 2 VIEW COMPARISON:  None. FINDINGS: Cortical margins of the radius and ulna are intact. There is no evidence of fracture or other focal bone lesions. Wrist and elbow alignment are maintained, patient had difficulty with positioning. Soft tissues are unremarkable. IMPRESSION: Negative radiographs of the right forearm. Electronically Signed   By: Narda Rutherford M.D.   On: 11/13/2019 02:14   CT Angio Neck W and/or Wo Contrast  Result Date: 11/13/2019 CLINICAL DATA:  Initial evaluation for neuro deficit, acute stroke. EXAM: CT ANGIOGRAPHY HEAD AND NECK CT PERFUSION BRAIN TECHNIQUE: Multidetector CT imaging of the head and neck was performed using the standard  protocol during bolus administration of intravenous contrast. Multiplanar CT image reconstructions and MIPs were obtained to evaluate the vascular anatomy. Carotid stenosis measurements (when applicable) are obtained utilizing NASCET criteria, using the distal internal carotid diameter as the denominator. Multiphase CT imaging of the brain was performed following IV bolus contrast injection. Subsequent parametric perfusion maps were calculated using RAPID software. CONTRAST:  OMNIPAQUE IOHEXOL 350 MG/ML SOLN COMPARISON:  Prior head CT from earlier the same day. FINDINGS: CTA NECK FINDINGS Aortic arch: Visualized aortic arch of normal caliber with normal branch pattern. No flow-limiting stenosis about the origin of the great vessels. Right carotid system: Right common and internal carotid arteries widely patent without stenosis, dissection or occlusion. Left carotid system: Left common and internal carotid arteries widely patent without stenosis, dissection or  occlusion. Vertebral arteries: Both vertebral arteries arise from the subclavian arteries. No proximal subclavian artery stenosis. Vertebral arteries widely patent without stenosis, dissection or occlusion. Skeleton: No acute osseous abnormality. No discrete or worrisome osseous lesions. Other neck: No other acute soft tissue abnormality within the neck. Upper chest: Visualized upper chest demonstrates no acute finding. Review of the MIP images confirms the above findings CTA HEAD FINDINGS Anterior circulation: Both internal carotid arteries widely patent to the termini without stenosis or other abnormality. A1 segments widely patent. Normal anterior communicating artery complex. Anterior cerebral arteries widely patent to their distal aspects without stenosis. No M1 stenosis or occlusion. Normal MCA bifurcations. Distal MCA branches well perfused and symmetric. Posterior circulation: Both vertebral arteries widely patent to the vertebrobasilar junction  without stenosis. Both picas patent. Basilar patent to its distal aspect without stenosis. Superior cerebral arteries patent bilaterally. Both PCAs primarily supplied via the basilar well perfused to their distal aspects. Venous sinuses: Patent allowing for timing the contrast bolus. Anatomic variants: None significant.  No intracranial aneurysm. Review of the MIP images confirms the above findings CT Brain Perfusion Findings: ASPECTS: 10 CBF (<30%) Volume: 71mL Perfusion (Tmax>6.0s) volume: 36mL Mismatch Volume: 40mL Infarction Location:Negative CT perfusion with no evidence for acute core infarct or other perfusion deficit. IMPRESSION: CTA HEAD AND NECK IMPRESSION: Normal CTA of the head and neck. No large vessel occlusion, hemodynamically significant stenosis, or other acute vascular abnormality. CT PERFUSION IMPRESSION: Negative CT perfusion. No evidence for acute core infarct or other perfusion deficit. Electronically Signed   By: Rise Mu M.D.   On: 11/13/2019 03:00   CT CEREBRAL PERFUSION W CONTRAST  Result Date: 11/13/2019 CLINICAL DATA:  Initial evaluation for neuro deficit, acute stroke. EXAM: CT ANGIOGRAPHY HEAD AND NECK CT PERFUSION BRAIN TECHNIQUE: Multidetector CT imaging of the head and neck was performed using the standard protocol during bolus administration of intravenous contrast. Multiplanar CT image reconstructions and MIPs were obtained to evaluate the vascular anatomy. Carotid stenosis measurements (when applicable) are obtained utilizing NASCET criteria, using the distal internal carotid diameter as the denominator. Multiphase CT imaging of the brain was performed following IV bolus contrast injection. Subsequent parametric perfusion maps were calculated using RAPID software. CONTRAST:  OMNIPAQUE IOHEXOL 350 MG/ML SOLN COMPARISON:  Prior head CT from earlier the same day. FINDINGS: CTA NECK FINDINGS Aortic arch: Visualized aortic arch of normal caliber with normal branch  pattern. No flow-limiting stenosis about the origin of the great vessels. Right carotid system: Right common and internal carotid arteries widely patent without stenosis, dissection or occlusion. Left carotid system: Left common and internal carotid arteries widely patent without stenosis, dissection or occlusion. Vertebral arteries: Both vertebral arteries arise from the subclavian arteries. No proximal subclavian artery stenosis. Vertebral arteries widely patent without stenosis, dissection or occlusion. Skeleton: No acute osseous abnormality. No discrete or worrisome osseous lesions. Other neck: No other acute soft tissue abnormality within the neck. Upper chest: Visualized upper chest demonstrates no acute finding. Review of the MIP images confirms the above findings CTA HEAD FINDINGS Anterior circulation: Both internal carotid arteries widely patent to the termini without stenosis or other abnormality. A1 segments widely patent. Normal anterior communicating artery complex. Anterior cerebral arteries widely patent to their distal aspects without stenosis. No M1 stenosis or occlusion. Normal MCA bifurcations. Distal MCA branches well perfused and symmetric. Posterior circulation: Both vertebral arteries widely patent to the vertebrobasilar junction without stenosis. Both picas patent. Basilar patent to its distal aspect without stenosis. Superior  cerebral arteries patent bilaterally. Both PCAs primarily supplied via the basilar well perfused to their distal aspects. Venous sinuses: Patent allowing for timing the contrast bolus. Anatomic variants: None significant.  No intracranial aneurysm. Review of the MIP images confirms the above findings CT Brain Perfusion Findings: ASPECTS: 10 CBF (<30%) Volume: 0mL Perfusion (Tmax>6.0s) volume: 0mL Mismatch Volume: 0mL Infarction Location:Negative CT perfusion with no evidence for acute core infarct or other perfusion deficit. IMPRESSION: CTA HEAD AND NECK IMPRESSION:  Normal CTA of the head and neck. No large vessel occlusion, hemodynamically significant stenosis, or other acute vascular abnormality. CT PERFUSION IMPRESSION: Negative CT perfusion. No evidence for acute core infarct or other perfusion deficit. Electronically Signed   By: Rise MuBenjamin  McClintock M.D.   On: 11/13/2019 03:00   DG Humerus Right  Result Date: 11/13/2019 CLINICAL DATA:  Pain.  Syncopal episode. EXAM: RIGHT HUMERUS - 2+ VIEW COMPARISON:  None. FINDINGS: The cortical margins of the humerus are intact. There is no evidence of fracture or other focal bone lesions. Shoulder and elbow alignment are maintained. Soft tissues are unremarkable. IMPRESSION: Negative radiographs of the right humerus. Electronically Signed   By: Narda RutherfordMelanie  Sanford M.D.   On: 11/13/2019 02:13   CT HEAD CODE STROKE WO CONTRAST  Result Date: 11/13/2019 CLINICAL DATA:  Code stroke. Initial evaluation for acute headache. Right facial droop. EXAM: CT HEAD WITHOUT CONTRAST TECHNIQUE: Contiguous axial images were obtained from the base of the skull through the vertex without intravenous contrast. COMPARISON:  None. FINDINGS: Brain: Cerebral volume within normal limits for patient age. No evidence for acute intracranial hemorrhage. No findings to suggest acute large vessel territory infarct. No mass lesion, midline shift, or mass effect. Ventricles are normal in size without evidence for hydrocephalus. No extra-axial fluid collection identified. Vascular: No hyperdense vessel identified. Skull: Scalp soft tissues demonstrate no acute abnormality. Calvarium intact. Sinuses/Orbits: Globes and orbital soft tissues within normal limits. Visualized paranasal sinuses are clear. No mastoid effusion. ASPECTS [facility]([place] Stroke Program Early CT Score) - Ganglionic level infarction (caudate, lentiform nuclei, internal capsule, insula, M1-M3 cortex): 7 - Supraganglionic infarction (M4-M6 cortex): 3 Total score (0-10 with 10 being normal): 10  IMPRESSION: 1. Normal head CT.  No acute intracranial abnormality identified. 2. ASPECTS is 10. These results were communicated to Dr. Otelia LimesLindzen at 12:50 amon 6/20/2021by text page via the [facility] messaging system. Electronically Signed   By: Rise MuBenjamin  McClintock M.D.   On: 11/13/2019 00:51   CT Maxillofacial Wo Contrast  Result Date: 11/13/2019 CLINICAL DATA:  Initial evaluation for acute facial trauma, right-sided facial pain. EXAM: CT MAXILLOFACIAL WITHOUT CONTRAST TECHNIQUE: Multidetector CT imaging of the maxillofacial structures was performed. Multiplanar CT image reconstructions were also generated. COMPARISON:  None. FINDINGS: Osseous: Zygomatic arches intact. No acute maxillary fracture. Pterygoid plates intact. Nasal bones intact. Nasal septum midline and intact. No acute mandibular fracture. Mandibular condyles normally situated. No acute abnormality about the dentition. Orbits: Globes and orbital soft tissues within normal limits. Bony orbits intact. Sinuses: Paranasal sinuses are clear. Mastoid air cells and middle ear cavities are well pneumatized and free of fluid. Soft tissues: No appreciable soft tissue injury about the face. Limited intracranial: Unremarkable. IMPRESSION: Negative maxillofacial CT. No acute maxillofacial injury identified. Electronically Signed   By: Rise MuBenjamin  McClintock M.D.   On: 11/13/2019 02:32    EKG: Independently reviewed.  Sinus rhythm, probable early repolarization abnormality.  No prior tracing for comparison.  Assessment/Plan Principal Problem:   Stroke-like symptoms Active Problems:   Syncope  Leukocytosis   Right arm pain   Facial pain   Strokelike symptoms: Patient presenting with complaints of right-sided facial droop and right upper extremity weakness/tingling.  Inconsistencies noted on neuro exam.  Work-up so far has not been suggestive of an acute stroke - Head CT negative for acute intracranial abnormality. CT cerebral perfusion study negative for  acute core infarct or other perfusion deficit. CTA head and neck negative for LVO. -Telemetry monitoring -MRI of the brain without contrast -Frequent neurochecks -PT, OT, speech therapy -UDS pending -Neurology consulted, appreciate recommendations -Keep n.p.o. until patient passes bedside or formal swallow eval  Syncope: Head CT negative.  Differentials include orthostatic, vasovagal, and cardiogenic cause (arrhythmia versus structural heart disease).  EKG showing sinus rhythm.  PE less likely given no tachycardia or hypoxia. -Cardiac monitoring, echocardiogram, check orthostatics  Mild leukocytosis: Likely reactive.  Patient is afebrile.  No infectious signs or symptoms. -Repeat CBC in a.m.  Right arm and forearm pain after syncopal episode/fall: X-rays of the right humerus and forearm negative for acute abnormality. -Check CK level  Right-sided facial pain: CT maxillofacial negative for acute abnormality. -Tylenol as needed  ?Hypertension: Listed in past medical history but not on any medications.  Currently normotensive. -Continue to monitor  DVT prophylaxis: Lovenox Code Status: Full code Family Communication: Results from diagnostic tests and treatment plan have been discussed with the patient. Disposition Plan: Status is: Observation  The patient remains OBS appropriate and will d/c before 2 midnights.  Dispo: The patient is from: Home              Anticipated d/c is to: Home              Anticipated d/c date is: 1 day              Patient currently is not medically stable to d/c.  The medical decision making on this patient was of high complexity and the patient is at high risk for clinical deterioration, therefore this is a level 3 visit.  John Giovanni MD [facility]  If 7PM-7AM, please contact night-coverage [email or web address]  11/13/2019, 6:00 AM

## 2019-11-13 NOTE — Progress Notes (Signed)
  Echocardiogram 2D Echocardiogram has been performed.  Kevin Sosa 11/13/2019, 1:16 PM

## 2019-11-13 NOTE — ED Triage Notes (Addendum)
Pt presents to ED POV. Pt c/o facial numbness and tingling. Pt was brought in earlier after syncopal episode. Left AMA. He states he "had something to do." When pt left he states R facial droop, R arm drift began. LKW  11/12/2019 2030

## 2019-11-13 NOTE — ED Notes (Signed)
Pt requesting to leave AMA. Risks of leaving and benefits of staying discussed w/ pt. MD notified of patients request to leave AMA.

## 2019-11-13 NOTE — ED Notes (Signed)
Patient transported to MRI 

## 2019-11-13 NOTE — ED Notes (Signed)
Pt became anxious and asked to leave AMA. PT taken back to room from CT and discussed with significant other who convinced him to stay.

## 2019-11-14 LAB — SARS CORONAVIRUS 2 (TAT 6-24 HRS): SARS Coronavirus 2: NEGATIVE

## 2019-11-14 NOTE — Discharge Summary (Signed)
Physician Discharge Summary  Kevin Sosa ZOX:[id number] DOB: 11/27/1995 DOA: 11/13/2019  PCP: Patient, No Pcp Per  Admit date: 11/13/2019 Discharge date: 11/14/2019     PATIENT LEFT AMA DESPITE THE RISKS OF LEAVING.   Brief/Interim Summary:  Kevin Sosa is a 24 y.o. male with medical history significant of hypertension not on meds presenting to the ED for evaluation of right facial droop and right arm drift.  He presented to the ED earlier today with complaints of a syncopal episode/ fall and left AMA before he could be evaluated.  Patient states around 7:30 PM he passed out.  States he was walking at home saw black spots in his vision and "my head was full of air."  Denies preceding lightheadedness/dizziness, chest pain, shortness of breath, or palpitations.  States he came to the ED but left.  After leaving the ED he could not move his right arm and had tingling in his fingers which made him return.  He is also having pain in his right arm and forearm.  He was admitted for evaluation of stroke.  Head CT negative for acute intracranial abnormality. CT cerebral perfusion study negative for acute core infarct or other perfusion deficit. CTA head and neck negative for LVO. X-rays of the right humerus and forearm negative for acute abnormality. CT maxillofacial negative for acute abnormality. MRI brain without contrast is negative for strke.   Patient left AMA.   Discharge Diagnoses:  Principal Problem:   Stroke-like symptoms Active Problems:   Syncope   Leukocytosis   Right arm pain   Facial pain    Discharge Instructions   Allergies as of 11/13/2019   No Known Allergies     Medication List    STOP taking these medications   moxifloxacin 0.5 % ophthalmic solution Commonly known as: Vigamox       No Known Allergies  Consultations: Neurology.   Procedures/Studies: CT Angio Head W or Wo Contrast  Result Date: 11/13/2019 CLINICAL DATA:  Initial evaluation for  neuro deficit, acute stroke. EXAM: CT ANGIOGRAPHY HEAD AND NECK CT PERFUSION BRAIN TECHNIQUE: Multidetector CT imaging of the head and neck was performed using the standard protocol during bolus administration of intravenous contrast. Multiplanar CT image reconstructions and MIPs were obtained to evaluate the vascular anatomy. Carotid stenosis measurements (when applicable) are obtained utilizing NASCET criteria, using the distal internal carotid diameter as the denominator. Multiphase CT imaging of the brain was performed following IV bolus contrast injection. Subsequent parametric perfusion maps were calculated using RAPID software. CONTRAST:  OMNIPAQUE IOHEXOL 350 MG/ML SOLN COMPARISON:  Prior head CT from earlier the same day. FINDINGS: CTA NECK FINDINGS Aortic arch: Visualized aortic arch of normal caliber with normal branch pattern. No flow-limiting stenosis about the origin of the great vessels. Right carotid system: Right common and internal carotid arteries widely patent without stenosis, dissection or occlusion. Left carotid system: Left common and internal carotid arteries widely patent without stenosis, dissection or occlusion. Vertebral arteries: Both vertebral arteries arise from the subclavian arteries. No proximal subclavian artery stenosis. Vertebral arteries widely patent without stenosis, dissection or occlusion. Skeleton: No acute osseous abnormality. No discrete or worrisome osseous lesions. Other neck: No other acute soft tissue abnormality within the neck. Upper chest: Visualized upper chest demonstrates no acute finding. Review of the MIP images confirms the above findings CTA HEAD FINDINGS Anterior circulation: Both internal carotid arteries widely patent to the termini without stenosis or other abnormality. A1 segments widely patent. Normal anterior communicating artery complex.  Anterior cerebral arteries widely patent to their distal aspects without stenosis. No M1 stenosis or  occlusion. Normal MCA bifurcations. Distal MCA branches well perfused and symmetric. Posterior circulation: Both vertebral arteries widely patent to the vertebrobasilar junction without stenosis. Both picas patent. Basilar patent to its distal aspect without stenosis. Superior cerebral arteries patent bilaterally. Both PCAs primarily supplied via the basilar well perfused to their distal aspects. Venous sinuses: Patent allowing for timing the contrast bolus. Anatomic variants: None significant.  No intracranial aneurysm. Review of the MIP images confirms the above findings CT Brain Perfusion Findings: ASPECTS: 10 CBF (<30%) Volume: 21mL Perfusion (Tmax>6.0s) volume: 26mL Mismatch Volume: 96mL Infarction Location:Negative CT perfusion with no evidence for acute core infarct or other perfusion deficit. IMPRESSION: CTA HEAD AND NECK IMPRESSION: Normal CTA of the head and neck. No large vessel occlusion, hemodynamically significant stenosis, or other acute vascular abnormality. CT PERFUSION IMPRESSION: Negative CT perfusion. No evidence for acute core infarct or other perfusion deficit. Electronically Signed   By: Jeannine Boga M.D.   On: 11/13/2019 03:00   DG Forearm Right  Result Date: 11/13/2019 CLINICAL DATA:  Pain, syncopal episode. EXAM: RIGHT FOREARM - 2 VIEW COMPARISON:  None. FINDINGS: Cortical margins of the radius and ulna are intact. There is no evidence of fracture or other focal bone lesions. Wrist and elbow alignment are maintained, patient had difficulty with positioning. Soft tissues are unremarkable. IMPRESSION: Negative radiographs of the right forearm. Electronically Signed   By: Keith Rake M.D.   On: 11/13/2019 02:14   CT Angio Neck W and/or Wo Contrast  Result Date: 11/13/2019 CLINICAL DATA:  Initial evaluation for neuro deficit, acute stroke. EXAM: CT ANGIOGRAPHY HEAD AND NECK CT PERFUSION BRAIN TECHNIQUE: Multidetector CT imaging of the head and neck was performed using the  standard protocol during bolus administration of intravenous contrast. Multiplanar CT image reconstructions and MIPs were obtained to evaluate the vascular anatomy. Carotid stenosis measurements (when applicable) are obtained utilizing NASCET criteria, using the distal internal carotid diameter as the denominator. Multiphase CT imaging of the brain was performed following IV bolus contrast injection. Subsequent parametric perfusion maps were calculated using RAPID software. CONTRAST:  125mL OMNIPAQUE IOHEXOL 350 MG/ML SOLN COMPARISON:  Prior head CT from earlier the same day. FINDINGS: CTA NECK FINDINGS Aortic arch: Visualized aortic arch of normal caliber with normal branch pattern. No flow-limiting stenosis about the origin of the great vessels. Right carotid system: Right common and internal carotid arteries widely patent without stenosis, dissection or occlusion. Left carotid system: Left common and internal carotid arteries widely patent without stenosis, dissection or occlusion. Vertebral arteries: Both vertebral arteries arise from the subclavian arteries. No proximal subclavian artery stenosis. Vertebral arteries widely patent without stenosis, dissection or occlusion. Skeleton: No acute osseous abnormality. No discrete or worrisome osseous lesions. Other neck: No other acute soft tissue abnormality within the neck. Upper chest: Visualized upper chest demonstrates no acute finding. Review of the MIP images confirms the above findings CTA HEAD FINDINGS Anterior circulation: Both internal carotid arteries widely patent to the termini without stenosis or other abnormality. A1 segments widely patent. Normal anterior communicating artery complex. Anterior cerebral arteries widely patent to their distal aspects without stenosis. No M1 stenosis or occlusion. Normal MCA bifurcations. Distal MCA branches well perfused and symmetric. Posterior circulation: Both vertebral arteries widely patent to the vertebrobasilar  junction without stenosis. Both picas patent. Basilar patent to its distal aspect without stenosis. Superior cerebral arteries patent bilaterally. Both PCAs primarily supplied via  the basilar well perfused to their distal aspects. Venous sinuses: Patent allowing for timing the contrast bolus. Anatomic variants: None significant.  No intracranial aneurysm. Review of the MIP images confirms the above findings CT Brain Perfusion Findings: ASPECTS: 10 CBF (<30%) Volume: 76mL Perfusion (Tmax>6.0s) volume: 45mL Mismatch Volume: 45mL Infarction Location:Negative CT perfusion with no evidence for acute core infarct or other perfusion deficit. IMPRESSION: CTA HEAD AND NECK IMPRESSION: Normal CTA of the head and neck. No large vessel occlusion, hemodynamically significant stenosis, or other acute vascular abnormality. CT PERFUSION IMPRESSION: Negative CT perfusion. No evidence for acute core infarct or other perfusion deficit. Electronically Signed   By: Rise Mu M.D.   On: 11/13/2019 03:00   MR BRAIN WO CONTRAST  Result Date: 11/13/2019 CLINICAL DATA:  Right arm weakness.  Right-sided facial droop. EXAM: MRI HEAD WITHOUT CONTRAST TECHNIQUE: Multiplanar, multiecho pulse sequences of the brain and surrounding structures were obtained without intravenous contrast. COMPARISON:  CT perfusion, CTA head neck, and CT head 11/13/2019 FINDINGS: Brain: No acute infarct, hemorrhage, or mass lesion is present. No significant white matter lesions are present. The ventricles are of normal size. No significant extraaxial fluid collection is present. The internal auditory canals are within normal limits. The brainstem and cerebellum are within normal limits. Vascular: Flow is present in the major intracranial arteries. Skull and upper cervical spine: The craniocervical junction is normal. Upper cervical spine is within normal limits. Marrow signal is unremarkable. Sinuses/Orbits: The paranasal sinuses and mastoid air cells are  clear. The globes and orbits are within normal limits. IMPRESSION: Negative MRI of the brain. Electronically Signed   By: Marin Roberts M.D.   On: 11/13/2019 09:01   CT CEREBRAL PERFUSION W CONTRAST  Result Date: 11/13/2019 CLINICAL DATA:  Initial evaluation for neuro deficit, acute stroke. EXAM: CT ANGIOGRAPHY HEAD AND NECK CT PERFUSION BRAIN TECHNIQUE: Multidetector CT imaging of the head and neck was performed using the standard protocol during bolus administration of intravenous contrast. Multiplanar CT image reconstructions and MIPs were obtained to evaluate the vascular anatomy. Carotid stenosis measurements (when applicable) are obtained utilizing NASCET criteria, using the distal internal carotid diameter as the denominator. Multiphase CT imaging of the brain was performed following IV bolus contrast injection. Subsequent parametric perfusion maps were calculated using RAPID software. CONTRAST:  OMNIPAQUE IOHEXOL 350 MG/ML SOLN COMPARISON:  Prior head CT from earlier the same day. FINDINGS: CTA NECK FINDINGS Aortic arch: Visualized aortic arch of normal caliber with normal branch pattern. No flow-limiting stenosis about the origin of the great vessels. Right carotid system: Right common and internal carotid arteries widely patent without stenosis, dissection or occlusion. Left carotid system: Left common and internal carotid arteries widely patent without stenosis, dissection or occlusion. Vertebral arteries: Both vertebral arteries arise from the subclavian arteries. No proximal subclavian artery stenosis. Vertebral arteries widely patent without stenosis, dissection or occlusion. Skeleton: No acute osseous abnormality. No discrete or worrisome osseous lesions. Other neck: No other acute soft tissue abnormality within the neck. Upper chest: Visualized upper chest demonstrates no acute finding. Review of the MIP images confirms the above findings CTA HEAD FINDINGS Anterior circulation: Both  internal carotid arteries widely patent to the termini without stenosis or other abnormality. A1 segments widely patent. Normal anterior communicating artery complex. Anterior cerebral arteries widely patent to their distal aspects without stenosis. No M1 stenosis or occlusion. Normal MCA bifurcations. Distal MCA branches well perfused and symmetric. Posterior circulation: Both vertebral arteries widely patent to the vertebrobasilar junction  without stenosis. Both picas patent. Basilar patent to its distal aspect without stenosis. Superior cerebral arteries patent bilaterally. Both PCAs primarily supplied via the basilar well perfused to their distal aspects. Venous sinuses: Patent allowing for timing the contrast bolus. Anatomic variants: None significant.  No intracranial aneurysm. Review of the MIP images confirms the above findings CT Brain Perfusion Findings: ASPECTS: 10 CBF (<30%) Volume: 0mL Perfusion (Tmax>6.0s) volume: 0mL Mismatch Volume: 0mL Infarction Location:Negative CT perfusion with no evidence for acute core infarct or other perfusion deficit. IMPRESSION: CTA HEAD AND NECK IMPRESSION: Normal CTA of the head and neck. No large vessel occlusion, hemodynamically significant stenosis, or other acute vascular abnormality. CT PERFUSION IMPRESSION: Negative CT perfusion. No evidence for acute core infarct or other perfusion deficit. Electronically Signed   By: Rise Mu M.D.   On: 11/13/2019 03:00   DG Humerus Right  Result Date: 11/13/2019 CLINICAL DATA:  Pain.  Syncopal episode. EXAM: RIGHT HUMERUS - 2+ VIEW COMPARISON:  None. FINDINGS: The cortical margins of the humerus are intact. There is no evidence of fracture or other focal bone lesions. Shoulder and elbow alignment are maintained. Soft tissues are unremarkable. IMPRESSION: Negative radiographs of the right humerus. Electronically Signed   By: Narda Rutherford M.D.   On: 11/13/2019 02:13   ECHOCARDIOGRAM COMPLETE  Result Date:  11/13/2019    ECHOCARDIOGRAM REPORT   Patient Name:   Kevin Sosa Date of Exam: 11/13/2019 Medical Rec #:  [id number]       Height:       66.0 in Accession #:    [id number]      Weight:       200.0 lb Date of Birth:  1995/11/04       BSA:          2.000 m Patient Age:    24 years        BP:           139/91 mmHg Patient Gender: M               HR:           78 bpm. Exam Location:  Inpatient Procedure: 2D Echo Indications:    syncope 780.2  History:        Patient has no prior history of Echocardiogram examinations.                 Risk Factors:Current Smoker.  Sonographer:    Delcie Roch Referring Phys: [id number] VASUNDHRA RATHORE IMPRESSIONS  1. Left ventricular ejection fraction, by estimation, is 60 to 65%. The left ventricle has normal function. The left ventricle has no regional wall motion abnormalities. Left ventricular diastolic parameters were normal.  2. Right ventricular systolic function is normal. The right ventricular size is normal. Tricuspid regurgitation signal is inadequate for assessing PA pressure.  3. The mitral valve is normal in structure. No evidence of mitral valve regurgitation. No evidence of mitral stenosis.  4. The aortic valve is tricuspid. Aortic valve regurgitation is not visualized. Mild aortic valve sclerosis is present, with no evidence of aortic valve stenosis.  5. The inferior vena cava is normal in size with greater than 50% respiratory variability, suggesting right atrial pressure of 3 mmHg. FINDINGS  Left Ventricle: Left ventricular ejection fraction, by estimation, is 60 to 65%. The left ventricle has normal function. The left ventricle has no regional wall motion abnormalities. The left ventricular internal cavity size was normal in size. There is  no left ventricular hypertrophy. Left  ventricular diastolic parameters were normal. Right Ventricle: The right ventricular size is normal. No increase in right ventricular wall thickness. Right ventricular systolic function  is normal. Tricuspid regurgitation signal is inadequate for assessing PA pressure. Left Atrium: Left atrial size was normal in size. Right Atrium: Right atrial size was normal in size. Pericardium: There is no evidence of pericardial effusion. Mitral Valve: The mitral valve is normal in structure. Normal mobility of the mitral valve leaflets. No evidence of mitral valve regurgitation. No evidence of mitral valve stenosis. Tricuspid Valve: The tricuspid valve is normal in structure. Tricuspid valve regurgitation is not demonstrated. No evidence of tricuspid stenosis. Aortic Valve: The aortic valve is tricuspid. Aortic valve regurgitation is not visualized. Mild aortic valve sclerosis is present, with no evidence of aortic valve stenosis. Pulmonic Valve: The pulmonic valve was normal in structure. Pulmonic valve regurgitation is not visualized. No evidence of pulmonic stenosis. Aorta: The aortic root is normal in size and structure. Venous: The inferior vena cava is normal in size with greater than 50% respiratory variability, suggesting right atrial pressure of 3 mmHg. IAS/Shunts: The interatrial septum appears to be lipomatous. No atrial level shunt detected by color flow Doppler.  LEFT VENTRICLE PLAX 2D LVIDd:         5.10 cm  Diastology LVIDs:         3.50 cm  LV e' lateral:   18.10 cm/s LV PW:         0.80 cm  LV E/e' lateral: 4.9 LV IVS:        0.90 cm  LV e' medial:    12.90 cm/s LVOT diam:     1.80 cm  LV E/e' medial:  6.9 LV SV:         45 LV SV Index:   23 LVOT Area:     2.54 cm  RIGHT VENTRICLE             IVC RV S prime:     11.20 cm/s  IVC diam: 1.40 cm TAPSE (M-mode): 2.2 cm LEFT ATRIUM             Index       RIGHT ATRIUM           Index LA diam:        3.00 cm 1.50 cm/m  RA Area:     11.80 cm LA Vol (A2C):   37.0 ml 18.50 ml/m RA Volume:   27.60 ml  13.80 ml/m LA Vol (A4C):   27.6 ml 13.80 ml/m LA Biplane Vol: 35.1 ml 17.55 ml/m  AORTIC VALVE LVOT Vmax:   106.00 cm/s LVOT Vmean:  66.900 cm/s  LVOT VTI:    0.177 m  AORTA Ao Root diam: 2.90 cm Ao Asc diam:  2.50 cm MITRAL VALVE MV Area (PHT): 3.85 cm    SHUNTS MV Decel Time: 197 msec    Systemic VTI:  0.18 m MV E velocity: 89.50 cm/s  Systemic Diam: 1.80 cm MV A velocity: 60.80 cm/s MV E/A ratio:  1.47 Armanda Magic MD Electronically signed by Armanda Magic MD Signature Date/Time: 11/13/2019/1:42:37 PM    Final    CT HEAD CODE STROKE WO CONTRAST  Result Date: 11/13/2019 CLINICAL DATA:  Code stroke. Initial evaluation for acute headache. Right facial droop. EXAM: CT HEAD WITHOUT CONTRAST TECHNIQUE: Contiguous axial images were obtained from the base of the skull through the vertex without intravenous contrast. COMPARISON:  None. FINDINGS: Brain: Cerebral volume within normal limits for patient age.  No evidence for acute intracranial hemorrhage. No findings to suggest acute large vessel territory infarct. No mass lesion, midline shift, or mass effect. Ventricles are normal in size without evidence for hydrocephalus. No extra-axial fluid collection identified. Vascular: No hyperdense vessel identified. Skull: Scalp soft tissues demonstrate no acute abnormality. Calvarium intact. Sinuses/Orbits: Globes and orbital soft tissues within normal limits. Visualized paranasal sinuses are clear. No mastoid effusion. ASPECTS [facility]([place] Stroke Program Early CT Score) - Ganglionic level infarction (caudate, lentiform nuclei, internal capsule, insula, M1-M3 cortex): 7 - Supraganglionic infarction (M4-M6 cortex): 3 Total score (0-10 with 10 being normal): 10 IMPRESSION: 1. Normal head CT.  No acute intracranial abnormality identified. 2. ASPECTS is 10. These results were communicated to Dr. Otelia LimesLindzen at 12:50 amon 6/20/2021by text page via the [facility] messaging system. Electronically Signed   By: Rise MuBenjamin  McClintock M.D.   On: 11/13/2019 00:51   CT Maxillofacial Wo Contrast  Result Date: 11/13/2019 CLINICAL DATA:  Initial evaluation for acute facial trauma, right-sided  facial pain. EXAM: CT MAXILLOFACIAL WITHOUT CONTRAST TECHNIQUE: Multidetector CT imaging of the maxillofacial structures was performed. Multiplanar CT image reconstructions were also generated. COMPARISON:  None. FINDINGS: Osseous: Zygomatic arches intact. No acute maxillary fracture. Pterygoid plates intact. Nasal bones intact. Nasal septum midline and intact. No acute mandibular fracture. Mandibular condyles normally situated. No acute abnormality about the dentition. Orbits: Globes and orbital soft tissues within normal limits. Bony orbits intact. Sinuses: Paranasal sinuses are clear. Mastoid air cells and middle ear cavities are well pneumatized and free of fluid. Soft tissues: No appreciable soft tissue injury about the face. Limited intracranial: Unremarkable. IMPRESSION: Negative maxillofacial CT. No acute maxillofacial injury identified. Electronically Signed   By: Rise MuBenjamin  McClintock M.D.   On: 11/13/2019 02:32    Discharge Exam: Vitals:   11/13/19 1101 11/13/19 1349  BP: (!) 116/59   Pulse: 73 89  Resp: 17 (!) 26  Temp:    SpO2: 97% 100%   Vitals:   11/13/19 0659 11/13/19 0913 11/13/19 1101 11/13/19 1349  BP: 133/83 (!) 139/91 (!) 116/59   Pulse: 71 86 73 89  Resp: (!) 21 16 17  (!) 26  Temp:      TempSrc:      SpO2: 96% 100% 97% 100%  Weight:  90.7 kg    Height:  5\' 6"  (1.676 m)         The results of significant diagnostics from this hospitalization (including imaging, microbiology, ancillary and laboratory) are listed below for reference.     Microbiology: Recent Results (from the past 240 hour(s))  SARS CORONAVIRUS 2 (TAT 6-24 HRS) Nasopharyngeal Nasopharyngeal Swab     Status: None   Collection Time: 11/13/19  5:15 AM   Specimen: Nasopharyngeal Swab  Result Value Ref Range Status   SARS Coronavirus 2 NEGATIVE NEGATIVE Final    Comment: (NOTE) SARS-CoV-2 target nucleic acids are NOT DETECTED.  The SARS-CoV-2 RNA is generally detectable in upper and  lower respiratory specimens during the acute phase of infection. Negative results do not preclude SARS-CoV-2 infection, do not rule out co-infections with other pathogens, and should not be used as the sole basis for treatment or other patient management decisions. Negative results must be combined with clinical observations, patient history, and epidemiological information. The expected result is Negative.  Fact Sheet for Patients: HairSlick.nohttps://www.fda.gov/media/138098/download  Fact Sheet for Healthcare Providers: quierodirigir.comhttps://www.fda.gov/media/138095/download  This test is not yet approved or cleared by the [place] FDA and  has been authorized for detection and/or diagnosis of  SARS-CoV-2 by FDA under an Emergency Use Authorization (EUA). This EUA will remain  in effect (meaning this test can be used) for the duration of the COVID-19 declaration under Se ction 564(b)(1) of the Act, 21 U.S.C. section 360bbb-3(b)(1), unless the authorization is terminated or revoked sooner.  Performed at [facility], [address], [place], [place] [address]      Labs: BNP (last 3 results) No results for input(s): BNP in the last 8760 hours. Basic Metabolic Panel: Recent Labs  Lab 11/13/19 0031 11/13/19 0044  NA 143 144  K 4.8 4.2  CL 109 109  CO2 23  --   GLUCOSE 101* 99  BUN 13 16  CREATININE 1.38* 1.40*  CALCIUM 10.0  --    Liver Function Tests: Recent Labs  Lab 11/13/19 0031  AST 27  ALT 23  ALKPHOS 67  BILITOT 0.5  PROT 7.8  ALBUMIN 5.0   No results for input(s): LIPASE, AMYLASE in the last 168 hours. No results for input(s): AMMONIA in the last 168 hours. CBC: Recent Labs  Lab 11/13/19 0031 11/13/19 0044 11/13/19 0915  WBC 13.5*  --  8.2  NEUTROABS 11.0*  --   --   HGB 16.0 17.0 15.0  HCT 52.2* 50.0 48.3  MCV 82.2  --  82.6  PLT 291  --  230   Cardiac Enzymes: Recent Labs  Lab 11/13/19 0915  CKTOTAL 580*   BNP: Invalid input(s): POCBNP CBG: No  results for input(s): GLUCAP in the last 168 hours. D-Dimer No results for input(s): DDIMER in the last 72 hours. Hgb A1c No results for input(s): HGBA1C in the last 72 hours. Lipid Profile No results for input(s): CHOL, HDL, LDLCALC, TRIG, CHOLHDL, LDLDIRECT in the last 72 hours. Thyroid function studies No results for input(s): TSH, T4TOTAL, T3FREE, THYROIDAB in the last 72 hours.  Invalid input(s): FREET3 Anemia work up No results for input(s): VITAMINB12, FOLATE, FERRITIN, TIBC, IRON, RETICCTPCT in the last 72 hours. Urinalysis    Component Value Date/Time   COLORURINE YELLOW 02/15/2016 0337   APPEARANCEUR CLEAR 02/15/2016 0337   LABSPEC 1.017 02/15/2016 0337   PHURINE 7.0 02/15/2016 0337   GLUCOSEU NEGATIVE 02/15/2016 0337   HGBUR NEGATIVE 02/15/2016 0337   BILIRUBINUR NEGATIVE 02/15/2016 0337   KETONESUR NEGATIVE 02/15/2016 0337   PROTEINUR NEGATIVE 02/15/2016 0337   NITRITE NEGATIVE 02/15/2016 0337   LEUKOCYTESUR NEGATIVE 02/15/2016 0337   Sepsis Labs Invalid input(s): PROCALCITONIN,  WBC,  LACTICIDVEN Microbiology Recent Results (from the past 240 hour(s))  SARS CORONAVIRUS 2 (TAT 6-24 HRS) Nasopharyngeal Nasopharyngeal Swab     Status: None   Collection Time: 11/13/19  5:15 AM   Specimen: Nasopharyngeal Swab  Result Value Ref Range Status   SARS Coronavirus 2 NEGATIVE NEGATIVE Final    Comment: (NOTE) SARS-CoV-2 target nucleic acids are NOT DETECTED.  The SARS-CoV-2 RNA is generally detectable in upper and lower respiratory specimens during the acute phase of infection. Negative results do not preclude SARS-CoV-2 infection, do not rule out co-infections with other pathogens, and should not be used as the sole basis for treatment or other patient management decisions. Negative results must be combined with clinical observations, patient history, and epidemiological information. The expected result is Negative.  Fact Sheet for  Patients: HairSlick.no  Fact Sheet for Healthcare Providers: quierodirigir.com  This test is not yet approved or cleared by the [place] FDA and  has been authorized for detection and/or diagnosis of SARS-CoV-2 by FDA under an Emergency  Use Authorization (EUA). This EUA will remain  in effect (meaning this test can be used) for the duration of the COVID-19 declaration under Se ction 564(b)(1) of the Act, 21 U.S.C. section 360bbb-3(b)(1), unless the authorization is terminated or revoked sooner.  Performed at [facility], [address] [place], [place] [address]      Time coordinating discharge: 25 minutes.   SIGNED:   Kathlen Mody, MD  [facility] 11/14/2019, 8:59 AM

## 2020-02-28 ENCOUNTER — Encounter (HOSPITAL_COMMUNITY): Payer: Self-pay

## 2020-02-28 ENCOUNTER — Other Ambulatory Visit: Payer: Self-pay

## 2020-02-28 ENCOUNTER — Emergency Department (HOSPITAL_COMMUNITY)
Admission: EM | Admit: 2020-02-28 | Discharge: 2020-02-28 | Disposition: A | Discharge status: Home / Self Care | Payer: HRSA Program | Attending: Emergency Medicine | Admitting: Emergency Medicine

## 2020-02-28 ENCOUNTER — Emergency Department (HOSPITAL_COMMUNITY): Payer: HRSA Program

## 2020-02-28 DIAGNOSIS — F17210 Nicotine dependence, cigarettes, uncomplicated: Secondary | ICD-10-CM | POA: Diagnosis not present

## 2020-02-28 DIAGNOSIS — U071 COVID-19: Secondary | ICD-10-CM | POA: Diagnosis not present

## 2020-02-28 DIAGNOSIS — I10 Essential (primary) hypertension: Secondary | ICD-10-CM | POA: Diagnosis not present

## 2020-02-28 DIAGNOSIS — R079 Chest pain, unspecified: Secondary | ICD-10-CM | POA: Diagnosis present

## 2020-02-28 LAB — RESPIRATORY PANEL BY RT PCR (FLU A&B, COVID)
Influenza A by PCR: NEGATIVE
Influenza B by PCR: NEGATIVE
SARS Coronavirus 2 by RT PCR: POSITIVE — AB

## 2020-02-28 LAB — BASIC METABOLIC PANEL
Anion gap: 9 (ref 5–15)
BUN: 12 mg/dL (ref 6–20)
CO2: 25 mmol/L (ref 22–32)
Calcium: 9.2 mg/dL (ref 8.9–10.3)
Chloride: 105 mmol/L (ref 98–111)
Creatinine, Ser: 1.13 mg/dL (ref 0.61–1.24)
GFR calc non Af Amer: >60 mL/min (ref >60)
Glucose, Bld: 103 mg/dL — ABNORMAL HIGH (ref 70–99)
Potassium: 3.9 mmol/L (ref 3.5–5.1)
Sodium: 139 mmol/L (ref 135–145)

## 2020-02-28 LAB — CBC
HCT: 49.6 % (ref 39.0–52.0)
Hemoglobin: 15.5 g/dL (ref 13.0–17.0)
MCH: 25.5 pg — ABNORMAL LOW (ref 26.0–34.0)
MCHC: 31.3 g/dL (ref 30.0–36.0)
MCV: 81.6 fL (ref 80.0–100.0)
Platelets: 209 10*3/uL (ref 150–400)
RBC: 6.08 MIL/uL — ABNORMAL HIGH (ref 4.22–5.81)
RDW: 13 % (ref 11.5–15.5)
WBC: 5 10*3/uL (ref 4.0–10.5)
nRBC: 0 % (ref 0.0–0.2)

## 2020-02-28 LAB — TROPONIN I (HIGH SENSITIVITY): Troponin I (High Sensitivity): 3 ng/L (ref <18)

## 2020-02-28 MED ORDER — ALBUTEROL SULFATE HFA 108 (90 BASE) MCG/ACT IN AERS
2.0000 | INHALATION_SPRAY | Freq: Once | RESPIRATORY_TRACT | Status: AC
  Administered 2020-02-28: 2 via RESPIRATORY_TRACT
  Filled 2020-02-28 (×2): qty 6.7

## 2020-02-28 NOTE — ED Provider Notes (Signed)
[facility] EMERGENCY DEPARTMENT Provider Note   CSN: [id number] Arrival date & time: 02/28/20  1507     History Chief Complaint  Patient presents with  . Chest Pain  . Shortness of Breath    Kevin Sosa is a 24 y.o. male with pertinent past medical history of hypertension that presents the emergency department today for chest pain that started today.  Patient states that chest pain was tender to touch, all across his sternum, according to triage note also endorse shortness of breath, cough and anosmia for the past several days.  Denies these symptoms to me, states that he does not have a cough or shortness of breath.  States that his only symptom is chest pain which has now resolved.  Chest pain did not radiate anywhere, not worse upon exertion.  States that he feels better than when he came in.  Patient has been waiting in the waiting room for about 4 hours.  Denies any nausea, vomiting, congestion, sore throat, fevers, chills, diarrhea.  Denies any lung history, no asthma.  Denies any cardiac history.  States he generally healthy.  Works as a Engineer, materials.  No pleuritic component to chest pain. No difficulty breathing.  Has not been vaccinated against Covid.  No sick contacts. HPI     Past Medical History:  Diagnosis Date  . Hypertension     Patient Active Problem List   Diagnosis Date Noted  . Stroke-like symptoms 11/13/2019  . Syncope 11/13/2019  . Leukocytosis 11/13/2019  . Right arm pain 11/13/2019  . Facial pain 11/13/2019    Past Surgical History:  Procedure Laterality Date  . WISDOM TOOTH EXTRACTION         No family history on file.  Social History   Tobacco Use  . Smoking status: Current Every Day Smoker    Types: Cigarettes  . Smokeless tobacco: Never Used  Substance Use Topics  . Alcohol use: No    Comment: occasionally  . Drug use: No    Home Medications Prior to Admission medications   Not on File    Allergies      Patient has no known allergies.  Review of Systems   Review of Systems  Constitutional: Negative for chills, diaphoresis, fatigue and fever.  HENT: Negative for congestion, sore throat and trouble swallowing.   Eyes: Negative for pain and visual disturbance.  Respiratory: Negative for cough, shortness of breath and wheezing.   Cardiovascular: Positive for chest pain. Negative for palpitations and leg swelling.  Gastrointestinal: Negative for abdominal distention, abdominal pain, diarrhea, nausea and vomiting.  Genitourinary: Negative for difficulty urinating.  Musculoskeletal: Negative for back pain, neck pain and neck stiffness.  Skin: Negative for pallor.  Neurological: Negative for dizziness, speech difficulty, weakness and headaches.  Psychiatric/Behavioral: Negative for confusion.    Physical Exam Updated Vital Signs BP 119/64 (BP Location: Right Arm)   Pulse 90   Temp 98.2 F (36.8 C) (Oral)   Resp 19   SpO2 98%   Physical Exam Constitutional:      General: He is not in acute distress.    Appearance: Normal appearance. He is not ill-appearing, toxic-appearing or diaphoretic.     Comments: Patient without acute respiratory stress.  Patient is sitting comfortably in bed, no tripoding, use of accessory muscles.  Patient is speaking to me in full sentences.  Handling secretions well.  HENT:     Head: Normocephalic and atraumatic.     Jaw: There is normal jaw  occlusion. No trismus, swelling or malocclusion.     Nose: Congestion present. No rhinorrhea.     Right Sinus: No maxillary sinus tenderness or frontal sinus tenderness.     Left Sinus: No maxillary sinus tenderness or frontal sinus tenderness.     Mouth/Throat:     Mouth: Mucous membranes are moist. No oral lesions.     Dentition: Normal dentition.     Tongue: No lesions.     Palate: No mass and lesions.     Pharynx: Oropharynx is clear. Uvula midline. No pharyngeal swelling, oropharyngeal exudate, posterior  oropharyngeal erythema or uvula swelling.     Tonsils: No tonsillar exudate or tonsillar abscesses. 1+ on the right. 1+ on the left.     Comments: Patient without tonsillar enlargement or exudate.  No signs of peritonsillar abscess, palate without any tenderness or masses palpated.  No swelling under the tongue, uvula is midline without any inflammation. Eyes:     General: No visual field deficit.       Right eye: No discharge.        Left eye: No discharge.     Extraocular Movements: Extraocular movements intact.     Conjunctiva/sclera: Conjunctivae normal.     Pupils: Pupils are equal, round, and reactive to light.  Cardiovascular:     Rate and Rhythm: Normal rate and regular rhythm.     Pulses: Normal pulses.     Heart sounds: Normal heart sounds. No murmur heard.  No friction rub. No gallop.   Pulmonary:     Effort: Pulmonary effort is normal. No respiratory distress.     Breath sounds: Normal breath sounds. No stridor. No wheezing, rhonchi or rales.  Chest:     Chest wall: No tenderness.  Abdominal:     General: Abdomen is flat. Bowel sounds are normal. There is no distension.     Palpations: Abdomen is soft.     Tenderness: There is no abdominal tenderness. There is no right CVA tenderness or left CVA tenderness.  Musculoskeletal:        General: No swelling or tenderness. Normal range of motion.     Cervical back: Normal range of motion. No rigidity or tenderness.     Right lower leg: No edema.     Left lower leg: No edema.  Lymphadenopathy:     Cervical: No cervical adenopathy.  Skin:    General: Skin is warm and dry.     Capillary Refill: Capillary refill takes less than 2 seconds.     Findings: No erythema or rash.  Neurological:     General: No focal deficit present.     Mental Status: He is alert and oriented to person, place, and time.     Cranial Nerves: Cranial nerves are intact. No cranial nerve deficit or facial asymmetry.     Motor: Motor function is intact.  No weakness.     Coordination: Coordination is intact.     Gait: Gait is intact. Gait normal.  Psychiatric:        Mood and Affect: Mood normal.     ED Results / Procedures / Treatments   Labs (all labs ordered are listed, but only abnormal results are displayed) Labs Reviewed  RESPIRATORY PANEL BY RT PCR (FLU A&B, COVID) - Abnormal; Notable for the following components:      Result Value   SARS Coronavirus 2 by RT PCR POSITIVE (*)    All other components within normal limits  BASIC METABOLIC PANEL -  Abnormal; Notable for the following components:   Glucose, Bld 103 (*)    All other components within normal limits  CBC - Abnormal; Notable for the following components:   RBC 6.08 (*)    MCH 25.5 (*)    All other components within normal limits  TROPONIN I (HIGH SENSITIVITY)  TROPONIN I (HIGH SENSITIVITY)    EKG EKG Interpretation  Date/Time:  Tuesday February 28 2020 15:55:04 EDT Ventricular Rate:  80 PR Interval:  130 QRS Duration: 82 QT Interval:  332 QTC Calculation: 382 R Axis:   77 Text Interpretation: Normal sinus rhythm with sinus arrhythmia Abnormal QRS-T angle, consider primary T wave abnormality Abnormal ECG When compared with ECG of 11/13/2019, No significant change was found Confirmed by Dione BoozeGlick, David ([id number]) on 02/28/2020 4:05:18 PM Also confirmed by Dione BoozeGlick, David ([id number]), editor Elita QuickWatlington, Beverly ([id number])  on 02/29/2020 10:47:24 AM   Radiology DG Chest 2 View  Result Date: 02/28/2020 CLINICAL DATA:  chest tightness and SOB x 1 day. Hx of HTN. Pt is a current smoker. EXAM: CHEST - 2 VIEW COMPARISON:  X-ray neck 02/15/2016, CT angio neck 11/13/2019. FINDINGS: The heart size and mediastinal contours are within normal limits. No focal consolidation. No pulmonary edema. No pleural effusion. No pneumothorax. 2.5 cm oval peripherally calcified density overlying the left C7 transverse process of unclear etiology and may be external to the patient. IMPRESSION: No active  cardiopulmonary disease. Electronically Signed   By: Tish FredericksonMorgane  Naveau M.D.   On: 02/28/2020 16:52    Procedures Procedures (including critical care time)  Medications Ordered in ED Medications  albuterol (VENTOLIN HFA) 108 (90 Base) MCG/ACT inhaler 2 puff (2 puffs Inhalation Given 02/28/20 1905)    ED Course  I have reviewed the triage vital signs and the nursing notes.  Pertinent labs & imaging results that were available during my care of the patient were reviewed by me and considered in my medical decision making (see chart for details).    MDM Rules/Calculators/A&P                         Kevin Sosa is a 24 y.o. male with pertinent past medical history of hypertension that presents the emergency department today for chest pain that started today. Pt appears well, no difficulty breathing. Satting at 100% . Triage note concerning for Covid, ACS work-up ordered and Covid swab ordered.  Chest x-ray interpreted me with no acute cardiopulmonary disease.  EKG interpreted with no signs of ischemia.  Covid was positive.  BMP and CBC reassuring.  First troponin 3.  Since chest pain has been constant  For over 7 hours and Covid swab positive, do not think we need second troponin at this time.  HEAR score 0.  I think that patient is having myalgias in his chest, when I evaluated patient patient was no longer having chest pain.  No concerns for PE, not tachycardic or hypoxic.  Symptom onset yesterday.  Patient peers well, symptomatic treatment discussed.  Patient qualifies for MAB infusion due to BMI. Did message infusion clinic who will notify him about whether he qualifies.  Patient to be discharged, patient agreeable with this.  CDC guideline information provided.  We will also refer to Covid clinic for follow-up since patient does not have PCP.  Doubt need for further emergent work up at this time. I explained the diagnosis and have given explicit precautions to return to the ER including for  any other new or  worsening symptoms. The patient understands and accepts the medical plan as it's been dictated and I have answered their questions. Discharge instructions concerning home care and prescriptions have been given. The patient is STABLE and is discharged to home in good condition.  Final Clinical Impression(s) / ED Diagnoses Final diagnoses:  COVID    Rx / DC Orders ED Discharge Orders    None       Farrel Gordon, PA-C 02/29/20 1338    Benjiman Core, MD 02/29/20 2231

## 2020-02-28 NOTE — ED Triage Notes (Signed)
PT presents to ED with complaints of central CP since 1pm today. Also endorses SOB, cough, no smell or taste x several days

## 2020-02-28 NOTE — Discharge Instructions (Signed)
Your Covid test was positive.  Please follow CDC guidelines.  Mab infusion clinic should message you in the next 2 days about the infusion as we spoke about.  Please stay hydrated and get plenty of rest, take Tylenol as prescribed on the bottle for pain.  Please use the attached instructions.  Follow-up with primary care or the Covid clinic in the next couple of days.  If you have any new or worsening concerning symptoms or shortness of breath please come back to the emergency department.

## 2020-02-29 ENCOUNTER — Telehealth: Payer: Self-pay | Admitting: Nurse Practitioner

## 2020-02-29 NOTE — Telephone Encounter (Signed)
Called patient to discuss Covid symptoms and the use of casirivimab/imdevimab, a monoclonal antibody infusion for those with mild to moderate Covid symptoms and at a high risk of hospitalization.  Pt is qualified for this infusion at the [facility] due to; Specific high risk criteria : BMI > 25.  Message left to call back our hotline [phone].  Kaden Daughdrill, NP   

## 2020-03-02 ENCOUNTER — Telehealth: Payer: Self-pay | Admitting: General Practice

## 2020-03-02 NOTE — Telephone Encounter (Signed)
Pt was called per referral from [facility] to make appt w/ pccc. Unable to lvm

## 2020-11-17 IMAGING — DX DG CHEST 2V
2 series · 2 of 2 positions shown · non-contrast
Comparison: X-ray neck 02/15/2016, CT angio neck 11/13/2019.

CLINICAL DATA: chest tightness and SOB x 1 day. Hx of HTN. Pt is a
current smoker.

EXAM:
CHEST - 2 VIEW

[chest pa]
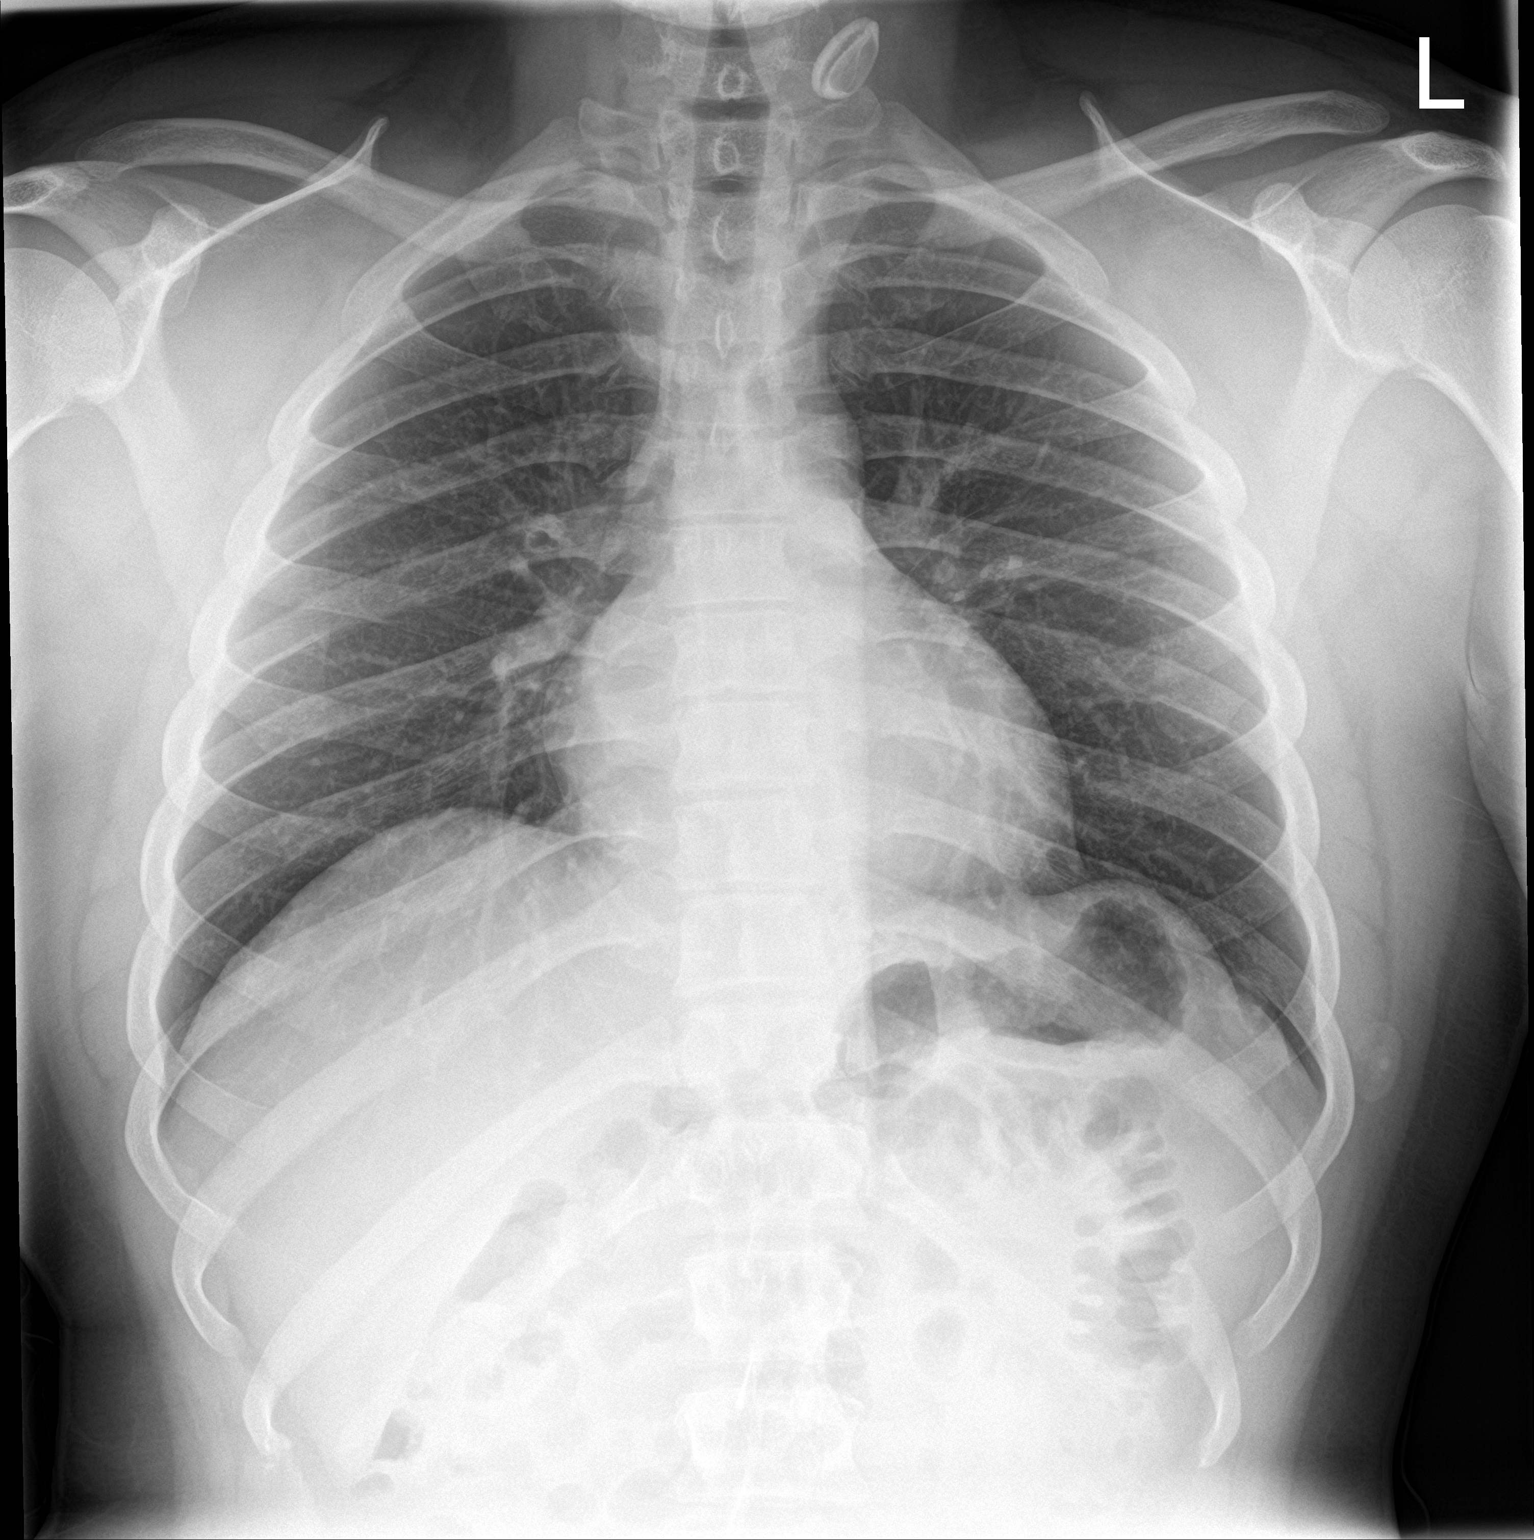

[chest lat]
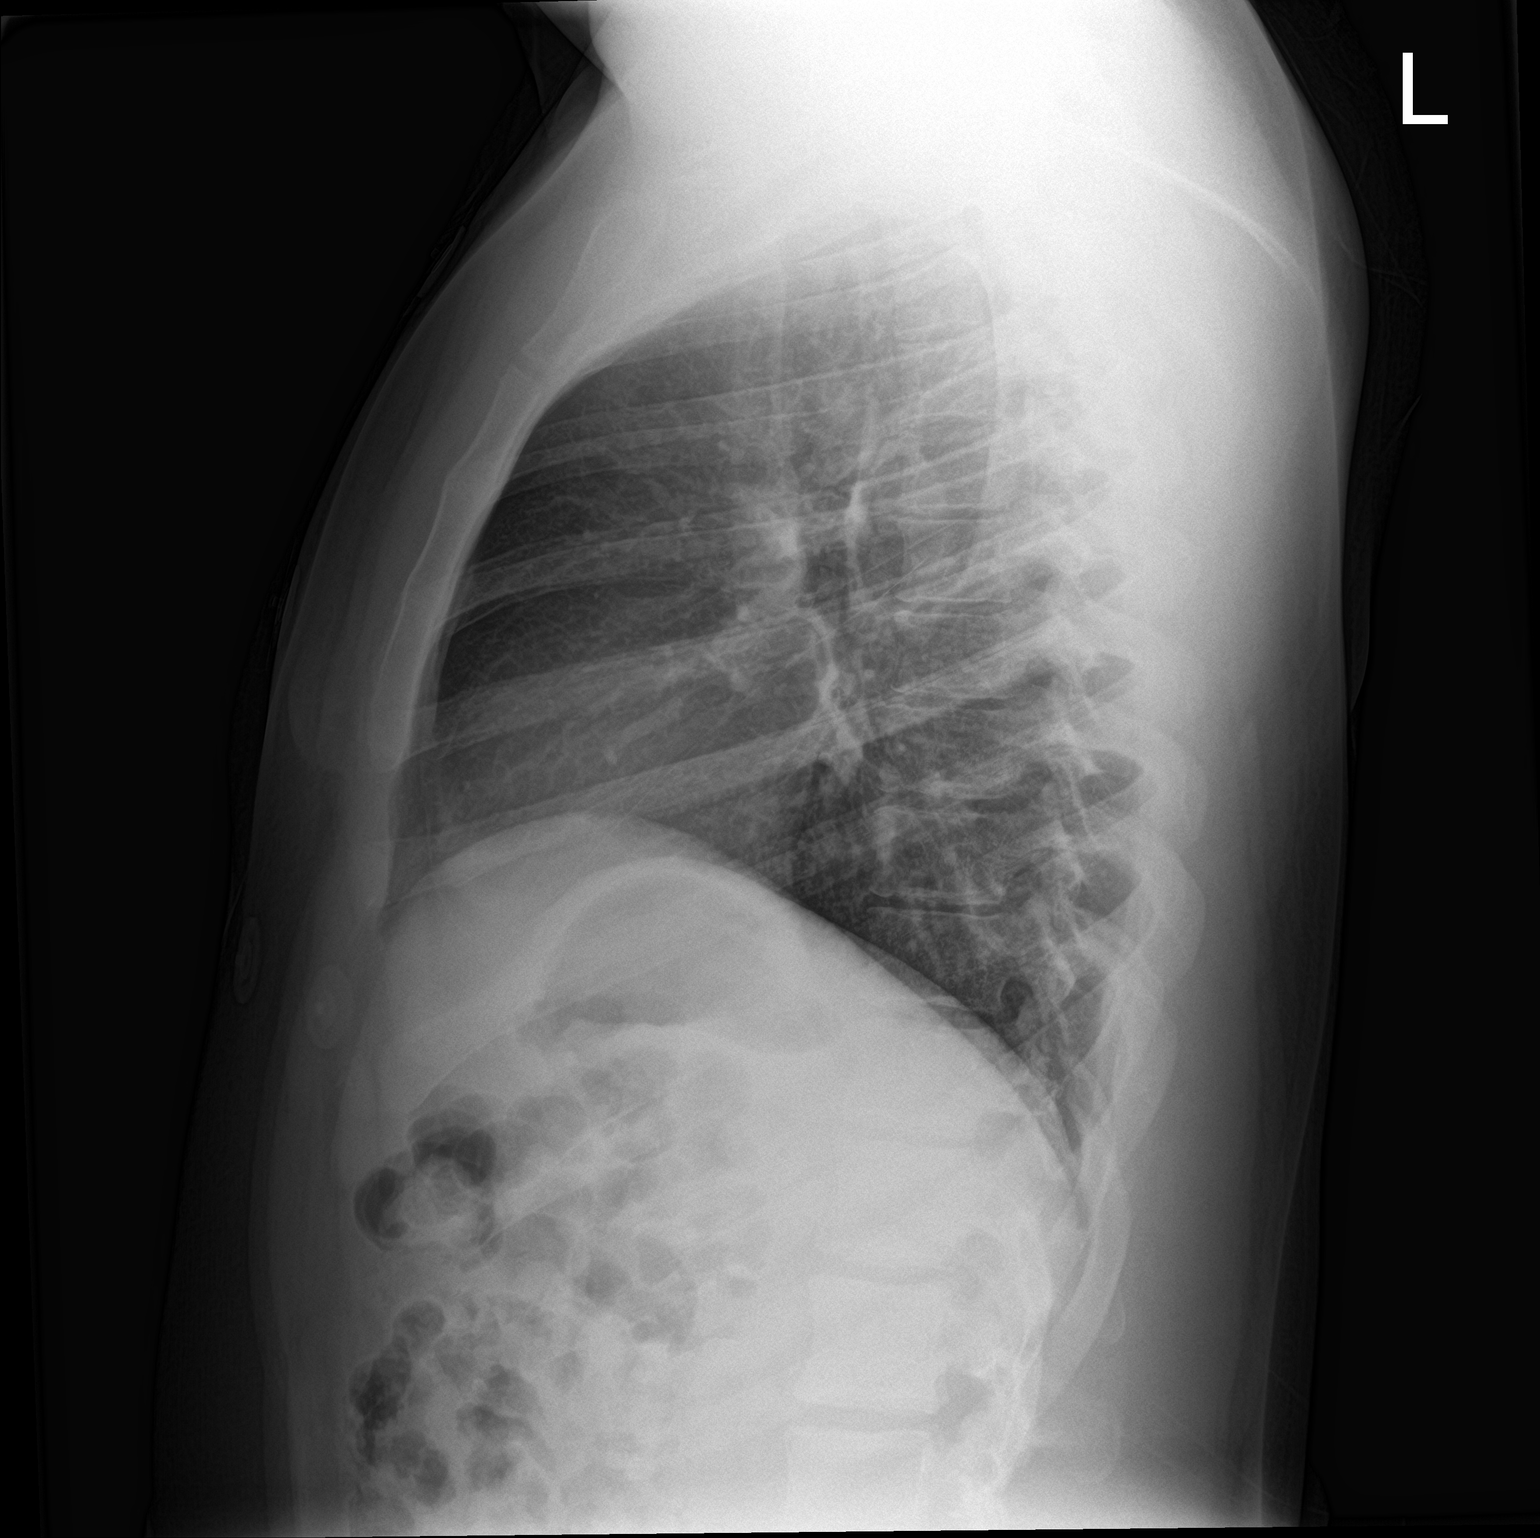

[2 of 2 positions shown; findings below may reference images not displayed]

FINDINGS: The heart size and mediastinal contours are within normal limits.

No focal consolidation. No pulmonary edema. No pleural effusion. No
pneumothorax.

2.5 cm oval peripherally calcified density overlying the left C7
transverse process of unclear etiology and may be external to the
patient.
IMPRESSION: No active cardiopulmonary disease.

## 2022-07-11 ENCOUNTER — Emergency Department (HOSPITAL_BASED_OUTPATIENT_CLINIC_OR_DEPARTMENT_OTHER)
Admission: EM | Admit: 2022-07-11 | Discharge: 2022-07-11 | Disposition: A | Discharge status: Home / Self Care | Payer: Medicaid Other | Attending: Emergency Medicine | Admitting: Emergency Medicine

## 2022-07-11 ENCOUNTER — Other Ambulatory Visit: Payer: Self-pay

## 2022-07-11 DIAGNOSIS — H538 Other visual disturbances: Secondary | ICD-10-CM | POA: Insufficient documentation

## 2022-07-11 DIAGNOSIS — S0501XA Injury of conjunctiva and corneal abrasion without foreign body, right eye, initial encounter: Secondary | ICD-10-CM

## 2022-07-11 DIAGNOSIS — H5711 Ocular pain, right eye: Secondary | ICD-10-CM | POA: Insufficient documentation

## 2022-07-11 MED ORDER — FLUORESCEIN SODIUM 1 MG OP STRP
1.0000 | ORAL_STRIP | Freq: Once | OPHTHALMIC | Status: AC
  Administered 2022-07-11: 1 via OPHTHALMIC
  Filled 2022-07-11: qty 1

## 2022-07-11 MED ORDER — TETRACAINE HCL 0.5 % OP SOLN
2.0000 [drp] | Freq: Once | OPHTHALMIC | Status: AC
  Administered 2022-07-11: 2 [drp] via OPHTHALMIC
  Filled 2022-07-11: qty 4

## 2022-07-11 MED ORDER — TOBRAMYCIN 0.3 % OP SOLN
2.0000 [drp] | Freq: Once | OPHTHALMIC | Status: AC
  Administered 2022-07-11: 2 [drp] via OPHTHALMIC
  Filled 2022-07-11: qty 5

## 2022-07-11 NOTE — ED Provider Notes (Signed)
  [place] EMERGENCY DEPARTMENT [facility] [place] [facility] Provider Note   CSN: [id number] Arrival date & time: 07/11/22  0009     History  Chief Complaint  Patient presents with   Eye Pain    Kevin Sosa is a 27 y.o. male.  Patient is a 27 year old male presenting with complaints of right eye pain and irritation and blurry vision.  He reports sleeping in his contact lenses and a history of corneal abrasions due to this.  He denies any other injury.  He denies any purulent drainage.  Discomfort worse with bright lights.  The history is provided by the patient.       Home Medications Prior to Admission medications   Not on File      Allergies    Patient has no known allergies.    Review of Systems   Review of Systems  All other systems reviewed and are negative.   Physical Exam Updated Vital Signs BP (!) 148/86 (BP Location: Right Arm)   Pulse (!) 108   Temp 98.7 F (37.1 C) (Oral)   Resp 16   Ht 5' 6"$  (1.676 m)   Wt 86.2 kg   SpO2 99%   BMI 30.67 kg/m  Physical Exam Vitals and nursing note reviewed.  Constitutional:      Appearance: Normal appearance.  HENT:     Head: Normocephalic and atraumatic.  Eyes:     Comments: The right conjunctiva is injected.  Pulmonary:     Effort: Pulmonary effort is normal.  Skin:    General: Skin is warm and dry.  Neurological:     Mental Status: He is alert.     ED Results / Procedures / Treatments   Labs (all labs ordered are listed, but only abnormal results are displayed) Labs Reviewed - No data to display  EKG None  Radiology No results found.  Procedures Procedures    Medications Ordered in ED Medications  fluorescein ophthalmic strip 1 strip (has no administration in time range)  tetracaine (PONTOCAINE) 0.5 % ophthalmic solution 2 drop (has no administration in time range)    ED Course/ Medical Decision Making/ A&P  Patient presenting with eye irritation as described in the HPI.  He is a  contact lens wearer who has been wearing them for extended periods of time.  The eye was examined and shows no obvious abnormality.  Fluorescein was used to examine under the [employer] and shows small punctate lesions, but no dendritic lesions or large abrasions.  Patient to be advised to leave his contact lenses out for the next week, use tobramycin eyedrops, and follow-up with eye doctor as needed.  Final Clinical Impression(s) / ED Diagnoses Final diagnoses:  None    Rx / DC Orders ED Discharge Orders     None         Veryl Speak, MD 07/11/22 [phone]

## 2022-07-11 NOTE — Discharge Instructions (Addendum)
Use tobramycin eyedrops, 2 drops every 4 hours while awake for the next 5 days.  No contact lens use for the next week.  Follow-up with your eye doctor if not improving in the next week, and return to the ER if symptoms significantly worsen or change.

## 2022-07-11 NOTE — ED Triage Notes (Signed)
Pt here for R eye pain that started 2 days ago. Pt reports he slept in his contact lenses and woke up w/ burning sensation.  Pt removed contact and has used eye drops but has pain, redness, swelling around eye. Pt states his vision in that eye is  "white fuzz". Hx of corneal abrasion, states this feels the same.

## 2022-08-26 ENCOUNTER — Emergency Department (HOSPITAL_BASED_OUTPATIENT_CLINIC_OR_DEPARTMENT_OTHER)
Admission: EM | Admit: 2022-08-26 | Discharge: 2022-08-26 | Disposition: A | Discharge status: Home / Self Care | Payer: Self-pay | Attending: Emergency Medicine | Admitting: Emergency Medicine

## 2022-08-26 ENCOUNTER — Encounter (HOSPITAL_BASED_OUTPATIENT_CLINIC_OR_DEPARTMENT_OTHER): Payer: Self-pay

## 2022-08-26 ENCOUNTER — Emergency Department (HOSPITAL_BASED_OUTPATIENT_CLINIC_OR_DEPARTMENT_OTHER): Payer: Medicaid Other

## 2022-08-26 ENCOUNTER — Other Ambulatory Visit: Payer: Self-pay

## 2022-08-26 DIAGNOSIS — M940 Chondrocostal junction syndrome [Tietze]: Secondary | ICD-10-CM | POA: Insufficient documentation

## 2022-08-26 DIAGNOSIS — I10 Essential (primary) hypertension: Secondary | ICD-10-CM | POA: Insufficient documentation

## 2022-08-26 MED ORDER — NAPROXEN 375 MG PO TABS: 375.0000 mg | ORAL_TABLET | Freq: Two times a day (BID) | ORAL | 0 refills | Status: AC

## 2022-08-26 MED ORDER — CYCLOBENZAPRINE HCL 10 MG PO TABS: 10.0000 mg | ORAL_TABLET | Freq: Two times a day (BID) | ORAL | 0 refills | Status: AC | PRN

## 2022-08-26 NOTE — ED Provider Notes (Signed)
[place] EMERGENCY DEPARTMENT AT [place] Provider Note   CSN: [id number] Arrival date & time: 08/26/22  1844     History  Chief Complaint  Patient presents with   Muscle Pain    Kevin Sosa is a 27 y.o. male with medical history of hypertension.  Patient presents to ED for evaluation of muscle pain.  Patient reports that prior to arrival, around 8 AM this morning, he was at his job.  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  Patient states that this morning he lifted a 120 pound pull and was attempting to place it into the anchor when he is felt a sudden onset of sharp pain beneath his right breast.  Patient states that this pain persisted throughout the day.  Patient reports that he took an ibuprofen at home which did not relieve the pain.  Patient reports he then took a Xanax as well as smoked marijuana which also did not help relieve the pain.  Patient reports that he does not have chest pain however does have pain right beneath his right breast that is worse with movement.  Patient denies shortness of breath, nausea, vomiting, fevers.   Muscle Pain Pertinent negatives include no chest pain and no shortness of breath.       Home Medications Prior to Admission medications   Medication Sig Start Date End Date Taking? Authorizing Provider  cyclobenzaprine (FLEXERIL) 10 MG tablet Take 1 tablet (10 mg total) by mouth 2 (two) times daily as needed for muscle spasms. 08/26/22  Yes Azucena Cecil, PA-C  naproxen (NAPROSYN) 375 MG tablet Take 1 tablet (375 mg total) by mouth 2 (two) times daily. 08/26/22  Yes Azucena Cecil, PA-C      Allergies    Patient has no known allergies.    Review of Systems   Review of Systems  Constitutional:  Negative for fever.  Respiratory:  Negative for shortness of breath.   Cardiovascular:  Negative for chest pain.  Musculoskeletal:  Positive for myalgias.  All other  systems reviewed and are negative.   Physical Exam Updated Vital Signs BP 118/69   Pulse 77   Temp (!) 97.4 F (36.3 C) (Oral)   Resp 18   Ht 5\' 7"  (1.702 m)   Wt 86.2 kg   SpO2 98%   BMI 29.76 kg/m  Physical Exam Vitals and nursing note reviewed.  Constitutional:      General: He is not in acute distress.    Appearance: Normal appearance. He is not ill-appearing, toxic-appearing or diaphoretic.  HENT:     Head: Normocephalic and atraumatic.     Nose: Nose normal.     Mouth/Throat:     Mouth: Mucous membranes are moist.     Pharynx: Oropharynx is clear.  Eyes:     Extraocular Movements: Extraocular movements intact.     Conjunctiva/sclera: Conjunctivae normal.     Pupils: Pupils are equal, round, and reactive to light.  Cardiovascular:     Rate and Rhythm: Normal rate and regular rhythm.  Pulmonary:     Effort: Pulmonary effort is normal.     Breath sounds: Normal breath sounds. No wheezing.  Chest:    Abdominal:     General: Abdomen is flat. Bowel sounds are normal.     Palpations: Abdomen is soft.     Tenderness: There is no abdominal tenderness.  Musculoskeletal:     Cervical back:  Normal range of motion and neck supple. No tenderness.  Skin:    General: Skin is warm and dry.     Capillary Refill: Capillary refill takes less than 2 seconds.  Neurological:     Mental Status: He is alert and oriented to person, place, and time.     ED Results / Procedures / Treatments   Labs (all labs ordered are listed, but only abnormal results are displayed) Labs Reviewed - No data to display  EKG None  Radiology DG Chest 2 View  Result Date: 08/26/2022 CLINICAL DATA:  cp EXAM: CHEST - 2 VIEW COMPARISON:  02/28/2020 FINDINGS: Lungs are clear. Heart size and mediastinal contours are within normal limits. No effusion.  No pneumothorax. Visualized bones unremarkable.  No fracture. IMPRESSION: No acute cardiopulmonary disease. Electronically Signed   By: Corlis Leak M.D.    On: 08/26/2022 19:23    Procedures Procedures   Medications Ordered in ED Medications - No data to display  ED Course/ Medical Decision Making/ A&P  Medical Decision Making Amount and/or Complexity of Data Reviewed Radiology: ordered.   27 year old male presents to the ED for evaluation.  Please see HPI for further details.  On examination the patient is afebrile and nontachycardic.  Lung sounds clear bilaterally, not hypoxic.  Abdomen soft and compressible throughout.  Patient does have tenderness inferior to his right breast without overlying skin change.  There is reproducible tenderness to the spot.  Patient reports that this is worse with movement, palpation.  Chest x-ray unremarkable.  Patient most likely suffering from costochondritis.  Patient was sent home with naproxen which she will take twice daily.  Will also send patient with muscle relaxers.  Patient be written out of work.  Return precautions were provided and the patient voiced understanding.  Patient had all of his questions answered to his satisfaction.  Patient stable for discharge.   Final Clinical Impression(s) / ED Diagnoses Final diagnoses:  Costochondritis    Rx / DC Orders ED Discharge Orders          Ordered    naproxen (NAPROSYN) 375 MG tablet  2 times daily        08/26/22 2204    cyclobenzaprine (FLEXERIL) 10 MG tablet  2 times daily PRN        08/26/22 2204              Clent Ridges 08/26/22 2204    Arby Barrette, MD 08/29/22 1705

## 2022-08-26 NOTE — ED Triage Notes (Signed)
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 States he took 600mg  ibuprofen, but feels pain again when taking deep breath, moving. Denies known resp/ cards hx

## 2022-08-26 NOTE — Discharge Instructions (Addendum)
Return to the ED with any new or worsening signs or symptoms Please read the attached guide concerning costochondritis Please see a work note excusing her from work for 2 days Please begin taking naproxen 375 mg twice daily.  You may also take muscle relaxer.  Muscle relaxer will cause drowsiness so please do not drive or operate heavy machinery under the influence of this medication As we discussed, I believe that you are suffering from costochondritis.  This is inflammation of your costal cartilage.  This is exacerbated by heavy lifting and pushing.  For these reasons, please excuse yourself from work for the next 2 days.  Please see the attached work note.  Please begin taking naproxen which is an anti-inflammatory medication.  He may also take the muscle relaxer on top of it.  Please follow-up with your PCP in 5 days for reevaluation.  If any symptoms change or worsen please return to the ED.

## 2024-04-02 ENCOUNTER — Emergency Department (HOSPITAL_BASED_OUTPATIENT_CLINIC_OR_DEPARTMENT_OTHER): Payer: Self-pay

## 2024-04-02 ENCOUNTER — Encounter (HOSPITAL_BASED_OUTPATIENT_CLINIC_OR_DEPARTMENT_OTHER): Payer: Self-pay | Admitting: Emergency Medicine

## 2024-04-02 ENCOUNTER — Other Ambulatory Visit: Payer: Self-pay

## 2024-04-02 ENCOUNTER — Emergency Department (HOSPITAL_BASED_OUTPATIENT_CLINIC_OR_DEPARTMENT_OTHER)
Admission: EM | Admit: 2024-04-02 | Discharge: 2024-04-02 | Disposition: A | Discharge status: Home / Self Care | Payer: Self-pay | Attending: Emergency Medicine | Admitting: Emergency Medicine

## 2024-04-02 DIAGNOSIS — L729 Follicular cyst of the skin and subcutaneous tissue, unspecified: Secondary | ICD-10-CM | POA: Insufficient documentation

## 2024-04-02 NOTE — ED Triage Notes (Signed)
 Pt reports 'knot' on top of head x years; sts started as a bee sting; feels pressure there now

## 2024-04-02 NOTE — Discharge Instructions (Signed)
 You have a cyst on your scalp.  Make sure to follow up with the surgeon for definitely management  Return for new or worsening symptoms

## 2024-04-02 NOTE — ED Provider Notes (Signed)
 [place] [facility] DEPARTMENT AT [facility] [place] Provider Note   CSN: [id number] Arrival date & time: 04/02/24  1603    Patient presents with: Mass   Kevin Sosa is a 28 y.o. male here for evaluation of mass to the superior aspect of his head.  Patient states this started few years ago, gradually increasing in size.  Initially thought it started due to an insect bite.  He denies any recent trauma or injury.  No headache, numbness, weakness, drainage.  Has not noted any color change however cannot see the area due to location.  No fever.  He has never taken antibiotics for this.  No history of abscesses.  Does wear his hair in braids.   HPI     Prior to Admission medications   Medication Sig Start Date End Date Taking? Authorizing Provider  cyclobenzaprine  (FLEXERIL ) 10 MG tablet Take 1 tablet (10 mg total) by mouth 2 (two) times daily as needed for muscle spasms. 08/26/22   Ruthell Lonni FALCON, PA-C  naproxen  (NAPROSYN ) 375 MG tablet Take 1 tablet (375 mg total) by mouth 2 (two) times daily. 08/26/22   Ruthell Lonni FALCON, PA-C    Allergies: Patient has no known allergies.    Review of Systems  Constitutional: Negative.   HENT: Negative.    Respiratory: Negative.    Cardiovascular: Negative.   Gastrointestinal: Negative.   Genitourinary: Negative.   Skin:        mass  Neurological: Negative.   All other systems reviewed and are negative.  Updated Vital Signs BP (!) 151/82 (BP Location: Right Arm)   Pulse 86   Temp 98.3 F (36.8 C) (Oral)   Resp 16   Ht 5' 7 (1.702 m)   Wt 97.5 kg   SpO2 100%   BMI 33.67 kg/m   Physical Exam Vitals and nursing note reviewed.  Constitutional:      General: He is not in acute distress.    Appearance: He is well-developed. He is not ill-appearing, toxic-appearing or diaphoretic.  HENT:     Head: Normocephalic and atraumatic.     Comments: 2 cm hard, appears fixed to skin mass superior aspect scalp.  No fluctuance.  No  erythema or warmth. Eyes:     Pupils: Pupils are equal, round, and reactive to light.  Cardiovascular:     Rate and Rhythm: Normal rate and regular rhythm.  Pulmonary:     Effort: Pulmonary effort is normal. No respiratory distress.  Abdominal:     General: There is no distension.     Palpations: Abdomen is soft.  Musculoskeletal:        General: Normal range of motion.     Cervical back: Normal range of motion and neck supple.  Skin:    General: Skin is warm and dry.  Neurological:     General: No focal deficit present.     Mental Status: He is alert and oriented to person, place, and time.     (all labs ordered are listed, but only abnormal results are displayed) Labs Reviewed - No data to display  EKG: None  Radiology: CT Head Wo Contrast Result Date: 04/02/2024 EXAM: CT HEAD WITHOUT CONTRAST 04/02/2024 05:55:34 PM TECHNIQUE: CT of the head was performed without the administration of intravenous contrast. Automated exposure control, iterative reconstruction, and/or weight based adjustment of the mA/kV was utilized to reduce the radiation dose to as low as reasonably achievable. COMPARISON: MRI head 11/13/2019 and CT 11/13/2019. CLINICAL HISTORY: Bone mass  or bone pain, skull, no prior imaging; parietal mass x years, increasing in size. FINDINGS: BRAIN AND VENTRICLES: No acute hemorrhage. No evidence of acute infarct. No hydrocephalus. No extra-axial collection. No mass effect or midline shift. ORBITS: No acute abnormality. SINUSES: No acute abnormality. SOFT TISSUES AND SKULL: 1.6 cm soft tissue nodule in high right parietal scalp near vertex. Most likely sebaceous cyst. This has slightly increased in size from 11/13/2019 when it measured approximately 12 mm. No skull fracture. IMPRESSION: 1. 1.6 cm soft tissue nodule in the high right parietal scalp near the vertex, most likely a sebaceous cyst, slightly increased in size from 11/13/19 (previously 12 mm). C 2. No acute intracranial  abnormality. Electronically signed by: Norman Gatlin MD 04/02/2024 06:08 PM EST RP Workstation: [id number]     Procedures   Medications Ordered in the ED - No data to display  28 year old here for evaluation of mass to superior aspect scalp which has been present for years however recently increasing in size.  Occasionally tender when he palpates the area.  Does occasionally wears hearing locks.  He cannot visualize the area.  See initially thought was due to an insect bite many years ago.  Here he is afebrile, nonseptic, non-ill-appearing.  No obvious traumatic injury.  He is 1.5 cm firm, appears fixed to skin mass.  Will plan on imaging.  Imaging personally viewed and interpreted:  CT head with likely sebaceous scalp cyst  Patient reassessed. Discussed imaging. Shared decision making, if painful can treat with abx for possible infected cyst however ultimately will need to FU with surgery for definitive management. Patient declined abd at this time. States he will FU with gen surgery                                   Medical Decision Making Amount and/or Complexity of Data Reviewed External Data Reviewed: radiology and notes. Radiology: ordered and independent interpretation performed. Decision-making details documented in ED Course.  Risk OTC drugs. Decision regarding hospitalization. Diagnosis or treatment significantly limited by social determinants of health.        Final diagnoses:  Scalp cyst    ED Discharge Orders     None          Azaiah Mello A, PA-C 04/02/24 1820    Franklyn Sid SAILOR, MD 04/02/24 1857
